# Patient Record
Sex: Male | Born: 1960 | Race: Black or African American | Hispanic: No | Marital: Married | Smoking: Never smoker
Health system: Southern US, Community
[De-identification: ages and names within clinical notes are randomized; demographics above are authoritative.]

## PROBLEM LIST (undated history)

## (undated) DIAGNOSIS — E119 Type 2 diabetes mellitus without complications: Secondary | ICD-10-CM

## (undated) DIAGNOSIS — G4730 Sleep apnea, unspecified: Secondary | ICD-10-CM

## (undated) DIAGNOSIS — N281 Cyst of kidney, acquired: Secondary | ICD-10-CM

## (undated) DIAGNOSIS — M1990 Unspecified osteoarthritis, unspecified site: Secondary | ICD-10-CM

## (undated) HISTORY — PX: APPENDECTOMY: SHX54

## (undated) HISTORY — PX: BACK SURGERY: SHX140

## (undated) HISTORY — PX: MEDIAL COLLATERAL LIGAMENT REPAIR, ELBOW: SHX2018

## (undated) HISTORY — PX: SINUS SURGERY WITH INSTATRAK: SHX5215

## (undated) HISTORY — PX: CHOLECYSTECTOMY: SHX55

---

## 2015-10-07 ENCOUNTER — Encounter (INDEPENDENT_AMBULATORY_CARE_PROVIDER_SITE_OTHER): Payer: BLUE CROSS/BLUE SHIELD | Admitting: Ophthalmology

## 2015-10-07 DIAGNOSIS — H2513 Age-related nuclear cataract, bilateral: Secondary | ICD-10-CM

## 2015-10-07 DIAGNOSIS — E11311 Type 2 diabetes mellitus with unspecified diabetic retinopathy with macular edema: Secondary | ICD-10-CM | POA: Diagnosis not present

## 2015-10-07 DIAGNOSIS — E113313 Type 2 diabetes mellitus with moderate nonproliferative diabetic retinopathy with macular edema, bilateral: Secondary | ICD-10-CM

## 2015-10-07 DIAGNOSIS — H43813 Vitreous degeneration, bilateral: Secondary | ICD-10-CM | POA: Diagnosis not present

## 2015-10-14 ENCOUNTER — Other Ambulatory Visit (INDEPENDENT_AMBULATORY_CARE_PROVIDER_SITE_OTHER): Payer: BLUE CROSS/BLUE SHIELD | Admitting: Ophthalmology

## 2015-10-14 DIAGNOSIS — E11311 Type 2 diabetes mellitus with unspecified diabetic retinopathy with macular edema: Secondary | ICD-10-CM

## 2015-10-14 DIAGNOSIS — E113312 Type 2 diabetes mellitus with moderate nonproliferative diabetic retinopathy with macular edema, left eye: Secondary | ICD-10-CM

## 2015-10-15 ENCOUNTER — Other Ambulatory Visit (INDEPENDENT_AMBULATORY_CARE_PROVIDER_SITE_OTHER): Payer: BLUE CROSS/BLUE SHIELD | Admitting: Ophthalmology

## 2016-02-11 ENCOUNTER — Ambulatory Visit (INDEPENDENT_AMBULATORY_CARE_PROVIDER_SITE_OTHER): Payer: BLUE CROSS/BLUE SHIELD | Admitting: Ophthalmology

## 2016-02-11 DIAGNOSIS — E11311 Type 2 diabetes mellitus with unspecified diabetic retinopathy with macular edema: Secondary | ICD-10-CM | POA: Diagnosis not present

## 2016-02-11 DIAGNOSIS — E113313 Type 2 diabetes mellitus with moderate nonproliferative diabetic retinopathy with macular edema, bilateral: Secondary | ICD-10-CM | POA: Diagnosis not present

## 2016-02-11 DIAGNOSIS — H43813 Vitreous degeneration, bilateral: Secondary | ICD-10-CM | POA: Diagnosis not present

## 2016-02-11 DIAGNOSIS — H2513 Age-related nuclear cataract, bilateral: Secondary | ICD-10-CM | POA: Diagnosis not present

## 2016-08-17 ENCOUNTER — Ambulatory Visit (INDEPENDENT_AMBULATORY_CARE_PROVIDER_SITE_OTHER): Payer: BLUE CROSS/BLUE SHIELD | Admitting: Ophthalmology

## 2016-08-17 DIAGNOSIS — E11319 Type 2 diabetes mellitus with unspecified diabetic retinopathy without macular edema: Secondary | ICD-10-CM

## 2016-08-17 DIAGNOSIS — H43813 Vitreous degeneration, bilateral: Secondary | ICD-10-CM

## 2016-08-17 DIAGNOSIS — E113393 Type 2 diabetes mellitus with moderate nonproliferative diabetic retinopathy without macular edema, bilateral: Secondary | ICD-10-CM

## 2017-02-16 ENCOUNTER — Ambulatory Visit (INDEPENDENT_AMBULATORY_CARE_PROVIDER_SITE_OTHER): Payer: BLUE CROSS/BLUE SHIELD | Admitting: Ophthalmology

## 2017-03-19 ENCOUNTER — Ambulatory Visit (INDEPENDENT_AMBULATORY_CARE_PROVIDER_SITE_OTHER): Payer: BLUE CROSS/BLUE SHIELD | Admitting: Ophthalmology

## 2017-03-19 DIAGNOSIS — H2513 Age-related nuclear cataract, bilateral: Secondary | ICD-10-CM | POA: Diagnosis not present

## 2017-03-19 DIAGNOSIS — E11311 Type 2 diabetes mellitus with unspecified diabetic retinopathy with macular edema: Secondary | ICD-10-CM | POA: Diagnosis not present

## 2017-03-19 DIAGNOSIS — H43813 Vitreous degeneration, bilateral: Secondary | ICD-10-CM | POA: Diagnosis not present

## 2017-03-19 DIAGNOSIS — E113392 Type 2 diabetes mellitus with moderate nonproliferative diabetic retinopathy without macular edema, left eye: Secondary | ICD-10-CM

## 2017-03-19 DIAGNOSIS — E113311 Type 2 diabetes mellitus with moderate nonproliferative diabetic retinopathy with macular edema, right eye: Secondary | ICD-10-CM | POA: Diagnosis not present

## 2017-09-24 ENCOUNTER — Ambulatory Visit (INDEPENDENT_AMBULATORY_CARE_PROVIDER_SITE_OTHER): Payer: BLUE CROSS/BLUE SHIELD | Admitting: Ophthalmology

## 2017-09-24 DIAGNOSIS — E113313 Type 2 diabetes mellitus with moderate nonproliferative diabetic retinopathy with macular edema, bilateral: Secondary | ICD-10-CM

## 2017-09-24 DIAGNOSIS — E11311 Type 2 diabetes mellitus with unspecified diabetic retinopathy with macular edema: Secondary | ICD-10-CM

## 2017-09-24 DIAGNOSIS — H2513 Age-related nuclear cataract, bilateral: Secondary | ICD-10-CM

## 2017-09-24 DIAGNOSIS — H43813 Vitreous degeneration, bilateral: Secondary | ICD-10-CM

## 2017-10-27 ENCOUNTER — Encounter (INDEPENDENT_AMBULATORY_CARE_PROVIDER_SITE_OTHER): Payer: BLUE CROSS/BLUE SHIELD | Admitting: Ophthalmology

## 2017-10-27 DIAGNOSIS — H2513 Age-related nuclear cataract, bilateral: Secondary | ICD-10-CM | POA: Diagnosis not present

## 2017-10-27 DIAGNOSIS — H43813 Vitreous degeneration, bilateral: Secondary | ICD-10-CM | POA: Diagnosis not present

## 2017-10-27 DIAGNOSIS — E11311 Type 2 diabetes mellitus with unspecified diabetic retinopathy with macular edema: Secondary | ICD-10-CM | POA: Diagnosis not present

## 2017-10-27 DIAGNOSIS — E113313 Type 2 diabetes mellitus with moderate nonproliferative diabetic retinopathy with macular edema, bilateral: Secondary | ICD-10-CM | POA: Diagnosis not present

## 2017-11-24 ENCOUNTER — Encounter (INDEPENDENT_AMBULATORY_CARE_PROVIDER_SITE_OTHER): Payer: BLUE CROSS/BLUE SHIELD | Admitting: Ophthalmology

## 2017-11-24 DIAGNOSIS — E103311 Type 1 diabetes mellitus with moderate nonproliferative diabetic retinopathy with macular edema, right eye: Secondary | ICD-10-CM | POA: Diagnosis not present

## 2017-11-24 DIAGNOSIS — H43813 Vitreous degeneration, bilateral: Secondary | ICD-10-CM

## 2017-11-24 DIAGNOSIS — E10311 Type 1 diabetes mellitus with unspecified diabetic retinopathy with macular edema: Secondary | ICD-10-CM | POA: Diagnosis not present

## 2017-11-24 DIAGNOSIS — E103392 Type 1 diabetes mellitus with moderate nonproliferative diabetic retinopathy without macular edema, left eye: Secondary | ICD-10-CM | POA: Diagnosis not present

## 2017-11-24 DIAGNOSIS — H2513 Age-related nuclear cataract, bilateral: Secondary | ICD-10-CM | POA: Diagnosis not present

## 2017-12-29 ENCOUNTER — Encounter (INDEPENDENT_AMBULATORY_CARE_PROVIDER_SITE_OTHER): Payer: BLUE CROSS/BLUE SHIELD | Admitting: Ophthalmology

## 2017-12-29 DIAGNOSIS — E11311 Type 2 diabetes mellitus with unspecified diabetic retinopathy with macular edema: Secondary | ICD-10-CM

## 2017-12-29 DIAGNOSIS — H2513 Age-related nuclear cataract, bilateral: Secondary | ICD-10-CM

## 2017-12-29 DIAGNOSIS — H43813 Vitreous degeneration, bilateral: Secondary | ICD-10-CM

## 2017-12-29 DIAGNOSIS — E113392 Type 2 diabetes mellitus with moderate nonproliferative diabetic retinopathy without macular edema, left eye: Secondary | ICD-10-CM | POA: Diagnosis not present

## 2017-12-29 DIAGNOSIS — E113311 Type 2 diabetes mellitus with moderate nonproliferative diabetic retinopathy with macular edema, right eye: Secondary | ICD-10-CM | POA: Diagnosis not present

## 2018-01-26 ENCOUNTER — Encounter (INDEPENDENT_AMBULATORY_CARE_PROVIDER_SITE_OTHER): Payer: BLUE CROSS/BLUE SHIELD | Admitting: Ophthalmology

## 2018-01-26 DIAGNOSIS — E113392 Type 2 diabetes mellitus with moderate nonproliferative diabetic retinopathy without macular edema, left eye: Secondary | ICD-10-CM | POA: Diagnosis not present

## 2018-01-26 DIAGNOSIS — E113311 Type 2 diabetes mellitus with moderate nonproliferative diabetic retinopathy with macular edema, right eye: Secondary | ICD-10-CM

## 2018-01-26 DIAGNOSIS — E11311 Type 2 diabetes mellitus with unspecified diabetic retinopathy with macular edema: Secondary | ICD-10-CM

## 2018-01-26 DIAGNOSIS — I10 Essential (primary) hypertension: Secondary | ICD-10-CM | POA: Diagnosis not present

## 2018-01-26 DIAGNOSIS — H43813 Vitreous degeneration, bilateral: Secondary | ICD-10-CM

## 2018-01-26 DIAGNOSIS — H2513 Age-related nuclear cataract, bilateral: Secondary | ICD-10-CM | POA: Diagnosis not present

## 2018-01-26 DIAGNOSIS — H35033 Hypertensive retinopathy, bilateral: Secondary | ICD-10-CM | POA: Diagnosis not present

## 2018-02-23 ENCOUNTER — Encounter (INDEPENDENT_AMBULATORY_CARE_PROVIDER_SITE_OTHER): Payer: BLUE CROSS/BLUE SHIELD | Admitting: Ophthalmology

## 2018-02-23 DIAGNOSIS — E11311 Type 2 diabetes mellitus with unspecified diabetic retinopathy with macular edema: Secondary | ICD-10-CM | POA: Diagnosis not present

## 2018-02-23 DIAGNOSIS — H43813 Vitreous degeneration, bilateral: Secondary | ICD-10-CM

## 2018-02-23 DIAGNOSIS — E113392 Type 2 diabetes mellitus with moderate nonproliferative diabetic retinopathy without macular edema, left eye: Secondary | ICD-10-CM

## 2018-02-23 DIAGNOSIS — E113311 Type 2 diabetes mellitus with moderate nonproliferative diabetic retinopathy with macular edema, right eye: Secondary | ICD-10-CM | POA: Diagnosis not present

## 2018-03-24 ENCOUNTER — Encounter (INDEPENDENT_AMBULATORY_CARE_PROVIDER_SITE_OTHER): Payer: BLUE CROSS/BLUE SHIELD | Admitting: Ophthalmology

## 2018-03-24 DIAGNOSIS — H43813 Vitreous degeneration, bilateral: Secondary | ICD-10-CM

## 2018-03-24 DIAGNOSIS — H2513 Age-related nuclear cataract, bilateral: Secondary | ICD-10-CM

## 2018-03-24 DIAGNOSIS — E113311 Type 2 diabetes mellitus with moderate nonproliferative diabetic retinopathy with macular edema, right eye: Secondary | ICD-10-CM | POA: Diagnosis not present

## 2018-03-24 DIAGNOSIS — E113392 Type 2 diabetes mellitus with moderate nonproliferative diabetic retinopathy without macular edema, left eye: Secondary | ICD-10-CM | POA: Diagnosis not present

## 2018-03-24 DIAGNOSIS — E11311 Type 2 diabetes mellitus with unspecified diabetic retinopathy with macular edema: Secondary | ICD-10-CM

## 2018-04-26 ENCOUNTER — Encounter (INDEPENDENT_AMBULATORY_CARE_PROVIDER_SITE_OTHER): Payer: BLUE CROSS/BLUE SHIELD | Admitting: Ophthalmology

## 2018-05-04 ENCOUNTER — Encounter (INDEPENDENT_AMBULATORY_CARE_PROVIDER_SITE_OTHER): Payer: BLUE CROSS/BLUE SHIELD | Admitting: Ophthalmology

## 2018-05-04 DIAGNOSIS — H2513 Age-related nuclear cataract, bilateral: Secondary | ICD-10-CM | POA: Diagnosis not present

## 2018-05-04 DIAGNOSIS — E11311 Type 2 diabetes mellitus with unspecified diabetic retinopathy with macular edema: Secondary | ICD-10-CM

## 2018-05-04 DIAGNOSIS — H43813 Vitreous degeneration, bilateral: Secondary | ICD-10-CM | POA: Diagnosis not present

## 2018-05-04 DIAGNOSIS — E113392 Type 2 diabetes mellitus with moderate nonproliferative diabetic retinopathy without macular edema, left eye: Secondary | ICD-10-CM | POA: Diagnosis not present

## 2018-05-04 DIAGNOSIS — E113311 Type 2 diabetes mellitus with moderate nonproliferative diabetic retinopathy with macular edema, right eye: Secondary | ICD-10-CM

## 2018-05-31 ENCOUNTER — Encounter (INDEPENDENT_AMBULATORY_CARE_PROVIDER_SITE_OTHER): Payer: BLUE CROSS/BLUE SHIELD | Admitting: Ophthalmology

## 2018-05-31 DIAGNOSIS — E113313 Type 2 diabetes mellitus with moderate nonproliferative diabetic retinopathy with macular edema, bilateral: Secondary | ICD-10-CM

## 2018-05-31 DIAGNOSIS — H43813 Vitreous degeneration, bilateral: Secondary | ICD-10-CM | POA: Diagnosis not present

## 2018-05-31 DIAGNOSIS — E11311 Type 2 diabetes mellitus with unspecified diabetic retinopathy with macular edema: Secondary | ICD-10-CM | POA: Diagnosis not present

## 2018-05-31 DIAGNOSIS — H2513 Age-related nuclear cataract, bilateral: Secondary | ICD-10-CM | POA: Diagnosis not present

## 2018-07-12 ENCOUNTER — Encounter (INDEPENDENT_AMBULATORY_CARE_PROVIDER_SITE_OTHER): Payer: BLUE CROSS/BLUE SHIELD | Admitting: Ophthalmology

## 2018-07-12 DIAGNOSIS — E113311 Type 2 diabetes mellitus with moderate nonproliferative diabetic retinopathy with macular edema, right eye: Secondary | ICD-10-CM

## 2018-07-12 DIAGNOSIS — E11311 Type 2 diabetes mellitus with unspecified diabetic retinopathy with macular edema: Secondary | ICD-10-CM

## 2018-07-12 DIAGNOSIS — H43813 Vitreous degeneration, bilateral: Secondary | ICD-10-CM | POA: Diagnosis not present

## 2018-07-12 DIAGNOSIS — E113392 Type 2 diabetes mellitus with moderate nonproliferative diabetic retinopathy without macular edema, left eye: Secondary | ICD-10-CM | POA: Diagnosis not present

## 2018-07-12 DIAGNOSIS — H2513 Age-related nuclear cataract, bilateral: Secondary | ICD-10-CM

## 2018-08-17 ENCOUNTER — Encounter (INDEPENDENT_AMBULATORY_CARE_PROVIDER_SITE_OTHER): Payer: BLUE CROSS/BLUE SHIELD | Admitting: Ophthalmology

## 2018-08-18 ENCOUNTER — Encounter (INDEPENDENT_AMBULATORY_CARE_PROVIDER_SITE_OTHER): Payer: BLUE CROSS/BLUE SHIELD | Admitting: Ophthalmology

## 2018-08-18 DIAGNOSIS — E113311 Type 2 diabetes mellitus with moderate nonproliferative diabetic retinopathy with macular edema, right eye: Secondary | ICD-10-CM

## 2018-08-18 DIAGNOSIS — H43813 Vitreous degeneration, bilateral: Secondary | ICD-10-CM | POA: Diagnosis not present

## 2018-08-18 DIAGNOSIS — E11311 Type 2 diabetes mellitus with unspecified diabetic retinopathy with macular edema: Secondary | ICD-10-CM

## 2018-08-18 DIAGNOSIS — H2513 Age-related nuclear cataract, bilateral: Secondary | ICD-10-CM

## 2018-08-18 DIAGNOSIS — E113392 Type 2 diabetes mellitus with moderate nonproliferative diabetic retinopathy without macular edema, left eye: Secondary | ICD-10-CM

## 2018-09-21 ENCOUNTER — Encounter (INDEPENDENT_AMBULATORY_CARE_PROVIDER_SITE_OTHER): Payer: BLUE CROSS/BLUE SHIELD | Admitting: Ophthalmology

## 2018-09-21 DIAGNOSIS — E11311 Type 2 diabetes mellitus with unspecified diabetic retinopathy with macular edema: Secondary | ICD-10-CM | POA: Diagnosis not present

## 2018-09-21 DIAGNOSIS — H2513 Age-related nuclear cataract, bilateral: Secondary | ICD-10-CM

## 2018-09-21 DIAGNOSIS — H43813 Vitreous degeneration, bilateral: Secondary | ICD-10-CM

## 2018-09-21 DIAGNOSIS — E113311 Type 2 diabetes mellitus with moderate nonproliferative diabetic retinopathy with macular edema, right eye: Secondary | ICD-10-CM | POA: Diagnosis not present

## 2018-09-21 DIAGNOSIS — E113592 Type 2 diabetes mellitus with proliferative diabetic retinopathy without macular edema, left eye: Secondary | ICD-10-CM

## 2018-09-28 ENCOUNTER — Encounter (INDEPENDENT_AMBULATORY_CARE_PROVIDER_SITE_OTHER): Payer: BLUE CROSS/BLUE SHIELD | Admitting: Ophthalmology

## 2018-09-28 DIAGNOSIS — E11311 Type 2 diabetes mellitus with unspecified diabetic retinopathy with macular edema: Secondary | ICD-10-CM | POA: Diagnosis not present

## 2018-09-28 DIAGNOSIS — E113592 Type 2 diabetes mellitus with proliferative diabetic retinopathy without macular edema, left eye: Secondary | ICD-10-CM | POA: Diagnosis not present

## 2018-10-27 ENCOUNTER — Encounter (INDEPENDENT_AMBULATORY_CARE_PROVIDER_SITE_OTHER): Payer: BLUE CROSS/BLUE SHIELD | Admitting: Ophthalmology

## 2018-10-27 DIAGNOSIS — E11311 Type 2 diabetes mellitus with unspecified diabetic retinopathy with macular edema: Secondary | ICD-10-CM

## 2018-10-27 DIAGNOSIS — E113592 Type 2 diabetes mellitus with proliferative diabetic retinopathy without macular edema, left eye: Secondary | ICD-10-CM | POA: Diagnosis not present

## 2018-10-27 DIAGNOSIS — H2513 Age-related nuclear cataract, bilateral: Secondary | ICD-10-CM

## 2018-10-27 DIAGNOSIS — H43813 Vitreous degeneration, bilateral: Secondary | ICD-10-CM

## 2018-10-27 DIAGNOSIS — E113311 Type 2 diabetes mellitus with moderate nonproliferative diabetic retinopathy with macular edema, right eye: Secondary | ICD-10-CM

## 2018-11-22 ENCOUNTER — Other Ambulatory Visit: Payer: Self-pay | Admitting: Urology

## 2018-11-22 DIAGNOSIS — N281 Cyst of kidney, acquired: Secondary | ICD-10-CM

## 2018-11-24 ENCOUNTER — Other Ambulatory Visit: Payer: Self-pay | Admitting: Family Medicine

## 2018-11-24 ENCOUNTER — Ambulatory Visit
Admission: RE | Admit: 2018-11-24 | Discharge: 2018-11-24 | Disposition: A | Discharge status: Home / Self Care | Payer: Self-pay | Source: Ambulatory Visit | Attending: Family Medicine | Admitting: Family Medicine

## 2018-11-24 DIAGNOSIS — R1084 Generalized abdominal pain: Secondary | ICD-10-CM

## 2018-11-25 ENCOUNTER — Other Ambulatory Visit: Payer: Self-pay | Admitting: Urology

## 2018-11-25 DIAGNOSIS — N281 Cyst of kidney, acquired: Secondary | ICD-10-CM

## 2018-11-28 ENCOUNTER — Other Ambulatory Visit: Payer: Self-pay | Admitting: Radiology

## 2018-11-29 ENCOUNTER — Other Ambulatory Visit: Payer: Self-pay | Admitting: Radiology

## 2018-12-01 ENCOUNTER — Encounter (HOSPITAL_COMMUNITY): Payer: Self-pay

## 2018-12-01 ENCOUNTER — Ambulatory Visit (HOSPITAL_COMMUNITY)
Admission: RE | Admit: 2018-12-01 | Discharge: 2018-12-01 | Disposition: A | Discharge status: Home / Self Care | Payer: BLUE CROSS/BLUE SHIELD | Source: Ambulatory Visit | Attending: Urology | Admitting: Urology

## 2018-12-01 DIAGNOSIS — Z833 Family history of diabetes mellitus: Secondary | ICD-10-CM | POA: Diagnosis not present

## 2018-12-01 DIAGNOSIS — Z9049 Acquired absence of other specified parts of digestive tract: Secondary | ICD-10-CM | POA: Insufficient documentation

## 2018-12-01 DIAGNOSIS — N281 Cyst of kidney, acquired: Secondary | ICD-10-CM | POA: Diagnosis not present

## 2018-12-01 DIAGNOSIS — M549 Dorsalgia, unspecified: Secondary | ICD-10-CM | POA: Insufficient documentation

## 2018-12-01 DIAGNOSIS — E119 Type 2 diabetes mellitus without complications: Secondary | ICD-10-CM | POA: Insufficient documentation

## 2018-12-01 DIAGNOSIS — Z7984 Long term (current) use of oral hypoglycemic drugs: Secondary | ICD-10-CM | POA: Insufficient documentation

## 2018-12-01 DIAGNOSIS — Z79899 Other long term (current) drug therapy: Secondary | ICD-10-CM | POA: Diagnosis not present

## 2018-12-01 HISTORY — DX: Type 2 diabetes mellitus without complications: E11.9

## 2018-12-01 HISTORY — DX: Cyst of kidney, acquired: N28.1

## 2018-12-01 LAB — CBC
HEMATOCRIT: 41.6 % (ref 39.0–52.0)
Hemoglobin: 13.6 g/dL (ref 13.0–17.0)
MCH: 28.8 pg (ref 26.0–34.0)
MCHC: 32.7 g/dL (ref 30.0–36.0)
MCV: 87.9 fL (ref 80.0–100.0)
Platelets: 220 10*3/uL (ref 150–400)
RBC: 4.73 MIL/uL (ref 4.22–5.81)
RDW: 11.9 % (ref 11.5–15.5)
WBC: 4.9 10*3/uL (ref 4.0–10.5)
nRBC: 0 % (ref 0.0–0.2)

## 2018-12-01 LAB — PROTIME-INR
INR: 0.91
Prothrombin Time: 12.2 seconds (ref 11.4–15.2)

## 2018-12-01 LAB — GLUCOSE, CAPILLARY: Glucose-Capillary: 203 mg/dL — ABNORMAL HIGH (ref 70–99)

## 2018-12-01 MED ORDER — LIDOCAINE HCL (PF) 1 % IJ SOLN
INTRAMUSCULAR | Status: AC
  Filled 2018-12-01: qty 30

## 2018-12-01 MED ORDER — MIDAZOLAM HCL 2 MG/2ML IJ SOLN
INTRAMUSCULAR | Status: AC
  Filled 2018-12-01: qty 2

## 2018-12-01 MED ORDER — SODIUM CHLORIDE 0.9 % IV SOLN: INTRAVENOUS | Status: DC

## 2018-12-01 MED ORDER — FENTANYL CITRATE (PF) 100 MCG/2ML IJ SOLN
INTRAMUSCULAR | Status: AC | PRN
  Administered 2018-12-01: 50 ug via INTRAVENOUS

## 2018-12-01 MED ORDER — MIDAZOLAM HCL 2 MG/2ML IJ SOLN
INTRAMUSCULAR | Status: AC | PRN
  Administered 2018-12-01: 1 mg via INTRAVENOUS
  Administered 2018-12-01: 0.5 mg via INTRAVENOUS

## 2018-12-01 MED ORDER — FENTANYL CITRATE (PF) 100 MCG/2ML IJ SOLN
INTRAMUSCULAR | Status: AC
  Filled 2018-12-01: qty 2

## 2018-12-01 NOTE — Discharge Instructions (Addendum)
Needle Biopsy, Care After °These instructions tell you how to care for yourself after your procedure. Your doctor may also give you more specific instructions. Call your doctor if you have any problems or questions. °What can I expect after the procedure? °After the procedure, it is common to have: °· Soreness. °· Bruising. °· Mild pain. °Follow these instructions at home: ° °· Return to your normal activities as told by your doctor. Ask your doctor what activities are safe for you. °· Take over-the-counter and prescription medicines only as told by your doctor. °· Wash your hands with soap and water before you change your bandage (dressing). If you cannot use soap and water, use hand sanitizer. °· Follow instructions from your doctor about: °? How to take care of your puncture site. °? When and how to change your bandage. °? When to remove your bandage. °· Check your puncture site every day for signs of infection. Watch for: °? Redness, swelling, or pain. °? Fluid or blood.  °? Pus or a bad smell. °? Warmth. °· Do not take baths, swim, or use a hot tub until your doctor approves. Ask your doctor if you may take showers. You may only be allowed to take sponge baths. °· Keep all follow-up visits as told by your doctor. This is important. °Contact a doctor if you have: °· A fever. °· Redness, swelling, or pain at the puncture site, and it lasts longer than a few days. °· Fluid, blood, or pus coming from the puncture site. °· Warmth coming from the puncture site. °Get help right away if: °· You have a lot of bleeding from the puncture site. °Summary °· After the procedure, it is common to have soreness, bruising, or mild pain at the puncture site. °· Check your puncture site every day for signs of infection, such as redness, swelling, or pain. °· Get help right away if you have severe bleeding from your puncture site. °This information is not intended to replace advice given to you by your health care provider. Make  sure you discuss any questions you have with your health care provider. °Document Released: 11/05/2008 Document Revised: 12/06/2017 Document Reviewed: 12/06/2017 °Elsevier Interactive Patient Education © 2019 [employer] °Moderate Conscious Sedation, Adult, Care After °These instructions provide you with information about caring for yourself after your procedure. Your health care provider may also give you more specific instructions. Your treatment has been planned according to current medical practices, but problems sometimes occur. Call your health care provider if you have any problems or questions after your procedure. °What can I expect after the procedure? °After your procedure, it is common: °· To feel sleepy for several hours. °· To feel clumsy and have poor balance for several hours. °· To have poor judgment for several hours. °· To vomit if you eat too soon. °Follow these instructions at home: °For at least 24 hours after the procedure: ° °· Do not: °? Participate in activities where you could fall or become injured. °? Drive. °? Use heavy machinery. °? Drink alcohol. °? Take sleeping pills or medicines that cause drowsiness. °? Make important decisions or sign legal documents. °? Take care of children on your own. °· Rest. °Eating and drinking °· Follow the diet recommended by your health care provider. °· If you vomit: °? Drink water, juice, or soup when you can drink without vomiting. °? Make sure you have little or no nausea before eating solid foods. °General instructions °· Have a responsible adult stay   with you until you are awake and alert. °· Take over-the-counter and prescription medicines only as told by your health care provider. °· If you smoke, do not smoke without supervision. °· Keep all follow-up visits as told by your health care provider. This is important. °Contact a health care provider if: °· You keep feeling nauseous or you keep vomiting. °· You feel light-headed. °· You develop a  rash. °· You have a fever. °Get help right away if: °· You have trouble breathing. °This information is not intended to replace advice given to you by your health care provider. Make sure you discuss any questions you have with your health care provider. °Document Released: 09/13/2013 Document Revised: 04/27/2016 Document Reviewed: 03/14/2016 °Elsevier Interactive Patient Education © 2019 Elsevier Inc. ° °

## 2018-12-01 NOTE — Procedures (Signed)
[place] guided R renal cyst aspiration 50 cc clear yellow fluid EBL 0 Comp 0

## 2018-12-01 NOTE — H&P (Signed)
Chief Complaint: Patient was seen in consultation today for Right renal cyst aspiration at the request of Wrenn,John  Referring Physician(s): Bjorn PippinWrenn,John  Supervising Physician: Jolaine ClickHoss, Arthur  [facility]  History of Present Illness: Marc Gwyneth SproutBrown Jr. is a 57 y.o. male   Right abd pain for months Rt back pain more recently  Referred to Dr Annabell HowellsWrenn Right renal cyst seen on outside films  (in PACS)  Now scheduled for aspiration    Past Medical History:  Diagnosis Date  . Diabetes mellitus without complication (HCC)   . Renal cyst     Past Surgical History:  Procedure Laterality Date  . APPENDECTOMY    . BACK SURGERY    . CHOLECYSTECTOMY    . MEDIAL COLLATERAL LIGAMENT REPAIR, ELBOW    . SINUS SURGERY WITH INSTATRAK      Allergies: Patient has no known allergies.  Medications: Prior to Admission medications   Medication Sig Start Date End Date Taking? Authorizing Provider  atorvastatin (LIPITOR) 40 MG tablet Take 40 mg by mouth daily.   Yes [provider]  empagliflozin (JARDIANCE) 25 MG TABS tablet Take 25 mg by mouth daily.   Yes [provider]  losartan (COZAAR) 50 MG tablet Take 50 mg by mouth daily.   Yes [provider]  sitaGLIPtin-metformin (JANUMET) 50-1000 MG tablet Take 1 tablet by mouth 2 (two) times daily with a meal.   Yes [provider]     Family History  Problem Relation Age of Onset  . Diabetes Mother   . Glaucoma Father     Social History   Socioeconomic History  . Marital status: Married    Spouse name: Not on file  . Number of children: Not on file  . Years of education: Not on file  . Highest education level: Not on file  Occupational History  . Not on file  Social Needs  . Financial resource strain: Not on file  . Food insecurity:    Worry: Not on file    Inability: Not on file  . Transportation needs:    Medical: Not on file    Non-medical: Not on file  Tobacco Use    . Smoking status: Never Smoker  . Smokeless tobacco: Never Used  Substance and Sexual Activity  . Alcohol use: Never    Frequency: Never  . Drug use: Never  . Sexual activity: Not on file  Lifestyle  . Physical activity:    Days per week: Not on file    Minutes per session: Not on file  . Stress: Not on file  Relationships  . Social connections:    Talks on phone: Not on file    Gets together: Not on file    Attends religious service: Not on file    Active member of club or organization: Not on file    Attends meetings of clubs or organizations: Not on file    Relationship status: Not on file  Other Topics Concern  . Not on file  Social History Narrative  . Not on file    Review of Systems: A 12 point ROS discussed and pertinent positives are indicated in the HPI above.  All other systems are negative.  Review of Systems  Constitutional: Positive for activity change. Negative for appetite change, fatigue and fever.  Respiratory: Negative for shortness of breath.   Cardiovascular: Negative for chest pain.  Gastrointestinal: Positive for abdominal pain.  Musculoskeletal: Positive for back pain.  Neurological: Negative  for weakness.  Psychiatric/Behavioral: Negative for behavioral problems and confusion.    Vital Signs: BP 125/82   Pulse (!) 47   Temp 97.9 F (36.6 C) (Oral)   Resp 16   Ht 5\' 11"  (1.803 m)   Wt 182 lb (82.6 kg)   SpO2 98%   BMI 25.38 kg/m   Physical Exam Vitals signs reviewed.  Cardiovascular:     Rate and Rhythm: Normal rate and regular rhythm.  Pulmonary:     Effort: Pulmonary effort is normal.     Breath sounds: Normal breath sounds.  Abdominal:     General: Bowel sounds are normal.     Palpations: Abdomen is soft.  Musculoskeletal: Normal range of motion.  Skin:    General: Skin is warm and dry.  Neurological:     General: No focal deficit present.     Mental Status: He is oriented to person, place, and time.  Psychiatric:         Mood and Affect: Mood normal.        Behavior: Behavior normal.        Thought Content: Thought content normal.        Judgment: Judgment normal.     Imaging: No results found.  Labs:  CBC: Recent Labs    12/01/18 0615  WBC 4.9  HGB 13.6  HCT 41.6  PLT 220    COAGS: Recent Labs    12/01/18 0615  INR 0.91    BMP: No results for input(s): NA, K, CL, CO2, GLUCOSE, BUN, CALCIUM, CREATININE, GFRNONAA, GFRAA in the last 8760 hours.  Invalid input(s): CMP  LIVER FUNCTION TESTS: No results for input(s): BILITOT, AST, ALT, ALKPHOS, PROT, ALBUMIN in the last 8760 hours.  TUMOR MARKERS: No results for input(s): AFPTM, CEA, CA199, CHROMGRNA in the last 8760 hours.  Assessment and Plan:  Abd and back pain for months Rt renal cyst noted on imaging Now scheduled for aspiration Risks and benefits discussed with the patient including, but not limited to bleeding, infection, damage to adjacent structures or low yield requiring additional tests.  All of the patient's questions were answered, patient is agreeable to proceed. Consent signed and in chart.    Thank you for this interesting consult.  I greatly enjoyed meeting Daimon Gwyneth SproutBrown Jr. and look forward to participating in their care.  A copy of this report was sent to the requesting provider on this date.  Electronically Signed: Robet LeuPamela A Ashiah Karpowicz, PA-C 12/01/2018, 7:49 AM   I spent a total of  30 Minutes   in face to face in clinical consultation, greater than 50% of which was counseling/coordinating care for right renal cyst aspiration

## 2018-12-02 ENCOUNTER — Ambulatory Visit (HOSPITAL_COMMUNITY): Payer: Self-pay

## 2018-12-08 ENCOUNTER — Encounter (INDEPENDENT_AMBULATORY_CARE_PROVIDER_SITE_OTHER): Payer: BLUE CROSS/BLUE SHIELD | Admitting: Ophthalmology

## 2018-12-08 DIAGNOSIS — E113311 Type 2 diabetes mellitus with moderate nonproliferative diabetic retinopathy with macular edema, right eye: Secondary | ICD-10-CM

## 2018-12-08 DIAGNOSIS — H43813 Vitreous degeneration, bilateral: Secondary | ICD-10-CM | POA: Diagnosis not present

## 2018-12-08 DIAGNOSIS — E113512 Type 2 diabetes mellitus with proliferative diabetic retinopathy with macular edema, left eye: Secondary | ICD-10-CM | POA: Diagnosis not present

## 2018-12-08 DIAGNOSIS — E11311 Type 2 diabetes mellitus with unspecified diabetic retinopathy with macular edema: Secondary | ICD-10-CM | POA: Diagnosis not present

## 2019-01-18 ENCOUNTER — Encounter (INDEPENDENT_AMBULATORY_CARE_PROVIDER_SITE_OTHER): Payer: BLUE CROSS/BLUE SHIELD | Admitting: Ophthalmology

## 2019-01-18 DIAGNOSIS — E11311 Type 2 diabetes mellitus with unspecified diabetic retinopathy with macular edema: Secondary | ICD-10-CM

## 2019-01-18 DIAGNOSIS — E113311 Type 2 diabetes mellitus with moderate nonproliferative diabetic retinopathy with macular edema, right eye: Secondary | ICD-10-CM | POA: Diagnosis not present

## 2019-01-18 DIAGNOSIS — H43813 Vitreous degeneration, bilateral: Secondary | ICD-10-CM | POA: Diagnosis not present

## 2019-01-18 DIAGNOSIS — E113592 Type 2 diabetes mellitus with proliferative diabetic retinopathy without macular edema, left eye: Secondary | ICD-10-CM | POA: Diagnosis not present

## 2019-03-08 ENCOUNTER — Encounter (INDEPENDENT_AMBULATORY_CARE_PROVIDER_SITE_OTHER): Payer: BLUE CROSS/BLUE SHIELD | Admitting: Ophthalmology

## 2019-03-08 ENCOUNTER — Other Ambulatory Visit: Payer: Self-pay

## 2019-03-08 DIAGNOSIS — E113311 Type 2 diabetes mellitus with moderate nonproliferative diabetic retinopathy with macular edema, right eye: Secondary | ICD-10-CM

## 2019-03-08 DIAGNOSIS — E113592 Type 2 diabetes mellitus with proliferative diabetic retinopathy without macular edema, left eye: Secondary | ICD-10-CM | POA: Diagnosis not present

## 2019-03-08 DIAGNOSIS — H2513 Age-related nuclear cataract, bilateral: Secondary | ICD-10-CM

## 2019-03-08 DIAGNOSIS — E11311 Type 2 diabetes mellitus with unspecified diabetic retinopathy with macular edema: Secondary | ICD-10-CM

## 2019-03-08 DIAGNOSIS — H43813 Vitreous degeneration, bilateral: Secondary | ICD-10-CM | POA: Diagnosis not present

## 2019-04-19 ENCOUNTER — Other Ambulatory Visit: Payer: Self-pay

## 2019-04-19 ENCOUNTER — Encounter (INDEPENDENT_AMBULATORY_CARE_PROVIDER_SITE_OTHER): Payer: BLUE CROSS/BLUE SHIELD | Admitting: Ophthalmology

## 2019-04-19 DIAGNOSIS — H43813 Vitreous degeneration, bilateral: Secondary | ICD-10-CM

## 2019-04-19 DIAGNOSIS — E113311 Type 2 diabetes mellitus with moderate nonproliferative diabetic retinopathy with macular edema, right eye: Secondary | ICD-10-CM | POA: Diagnosis not present

## 2019-04-19 DIAGNOSIS — E11311 Type 2 diabetes mellitus with unspecified diabetic retinopathy with macular edema: Secondary | ICD-10-CM

## 2019-04-19 DIAGNOSIS — H2513 Age-related nuclear cataract, bilateral: Secondary | ICD-10-CM

## 2019-04-19 DIAGNOSIS — E113592 Type 2 diabetes mellitus with proliferative diabetic retinopathy without macular edema, left eye: Secondary | ICD-10-CM | POA: Diagnosis not present

## 2019-05-31 ENCOUNTER — Encounter (INDEPENDENT_AMBULATORY_CARE_PROVIDER_SITE_OTHER): Payer: BLUE CROSS/BLUE SHIELD | Admitting: Ophthalmology

## 2019-05-31 ENCOUNTER — Other Ambulatory Visit: Payer: Self-pay

## 2019-05-31 DIAGNOSIS — H2513 Age-related nuclear cataract, bilateral: Secondary | ICD-10-CM

## 2019-05-31 DIAGNOSIS — H43813 Vitreous degeneration, bilateral: Secondary | ICD-10-CM | POA: Diagnosis not present

## 2019-05-31 DIAGNOSIS — E113313 Type 2 diabetes mellitus with moderate nonproliferative diabetic retinopathy with macular edema, bilateral: Secondary | ICD-10-CM

## 2019-05-31 DIAGNOSIS — E11311 Type 2 diabetes mellitus with unspecified diabetic retinopathy with macular edema: Secondary | ICD-10-CM | POA: Diagnosis not present

## 2019-05-31 DIAGNOSIS — E113592 Type 2 diabetes mellitus with proliferative diabetic retinopathy without macular edema, left eye: Secondary | ICD-10-CM | POA: Diagnosis not present

## 2019-06-28 ENCOUNTER — Encounter (INDEPENDENT_AMBULATORY_CARE_PROVIDER_SITE_OTHER): Payer: BC Managed Care – PPO | Admitting: Ophthalmology

## 2019-07-13 ENCOUNTER — Other Ambulatory Visit: Payer: Self-pay

## 2019-07-13 ENCOUNTER — Encounter (INDEPENDENT_AMBULATORY_CARE_PROVIDER_SITE_OTHER): Payer: BC Managed Care – PPO | Admitting: Ophthalmology

## 2019-07-13 DIAGNOSIS — H2513 Age-related nuclear cataract, bilateral: Secondary | ICD-10-CM

## 2019-07-13 DIAGNOSIS — E103592 Type 1 diabetes mellitus with proliferative diabetic retinopathy without macular edema, left eye: Secondary | ICD-10-CM

## 2019-07-13 DIAGNOSIS — E10311 Type 1 diabetes mellitus with unspecified diabetic retinopathy with macular edema: Secondary | ICD-10-CM | POA: Diagnosis not present

## 2019-07-13 DIAGNOSIS — H43813 Vitreous degeneration, bilateral: Secondary | ICD-10-CM | POA: Diagnosis not present

## 2019-07-13 DIAGNOSIS — E103311 Type 1 diabetes mellitus with moderate nonproliferative diabetic retinopathy with macular edema, right eye: Secondary | ICD-10-CM | POA: Diagnosis not present

## 2019-08-09 ENCOUNTER — Encounter (INDEPENDENT_AMBULATORY_CARE_PROVIDER_SITE_OTHER): Payer: BC Managed Care – PPO | Admitting: Ophthalmology

## 2019-08-24 ENCOUNTER — Encounter (INDEPENDENT_AMBULATORY_CARE_PROVIDER_SITE_OTHER): Payer: BC Managed Care – PPO | Admitting: Ophthalmology

## 2019-08-24 ENCOUNTER — Other Ambulatory Visit: Payer: Self-pay

## 2019-08-24 DIAGNOSIS — H43813 Vitreous degeneration, bilateral: Secondary | ICD-10-CM

## 2019-08-24 DIAGNOSIS — E113311 Type 2 diabetes mellitus with moderate nonproliferative diabetic retinopathy with macular edema, right eye: Secondary | ICD-10-CM | POA: Diagnosis not present

## 2019-08-24 DIAGNOSIS — E113592 Type 2 diabetes mellitus with proliferative diabetic retinopathy without macular edema, left eye: Secondary | ICD-10-CM | POA: Diagnosis not present

## 2019-08-24 DIAGNOSIS — E11311 Type 2 diabetes mellitus with unspecified diabetic retinopathy with macular edema: Secondary | ICD-10-CM

## 2019-08-24 DIAGNOSIS — H2513 Age-related nuclear cataract, bilateral: Secondary | ICD-10-CM

## 2019-10-04 ENCOUNTER — Encounter (INDEPENDENT_AMBULATORY_CARE_PROVIDER_SITE_OTHER): Payer: BC Managed Care – PPO | Admitting: Ophthalmology

## 2019-10-04 DIAGNOSIS — E11311 Type 2 diabetes mellitus with unspecified diabetic retinopathy with macular edema: Secondary | ICD-10-CM

## 2019-10-04 DIAGNOSIS — H2513 Age-related nuclear cataract, bilateral: Secondary | ICD-10-CM

## 2019-10-04 DIAGNOSIS — E113512 Type 2 diabetes mellitus with proliferative diabetic retinopathy with macular edema, left eye: Secondary | ICD-10-CM

## 2019-10-04 DIAGNOSIS — E113311 Type 2 diabetes mellitus with moderate nonproliferative diabetic retinopathy with macular edema, right eye: Secondary | ICD-10-CM

## 2019-10-04 DIAGNOSIS — H43813 Vitreous degeneration, bilateral: Secondary | ICD-10-CM | POA: Diagnosis not present

## 2019-11-15 ENCOUNTER — Encounter (INDEPENDENT_AMBULATORY_CARE_PROVIDER_SITE_OTHER): Payer: BC Managed Care – PPO | Admitting: Ophthalmology

## 2019-11-15 DIAGNOSIS — H43813 Vitreous degeneration, bilateral: Secondary | ICD-10-CM

## 2019-11-15 DIAGNOSIS — E11311 Type 2 diabetes mellitus with unspecified diabetic retinopathy with macular edema: Secondary | ICD-10-CM | POA: Diagnosis not present

## 2019-11-15 DIAGNOSIS — E113592 Type 2 diabetes mellitus with proliferative diabetic retinopathy without macular edema, left eye: Secondary | ICD-10-CM

## 2019-11-15 DIAGNOSIS — E113311 Type 2 diabetes mellitus with moderate nonproliferative diabetic retinopathy with macular edema, right eye: Secondary | ICD-10-CM

## 2019-11-15 DIAGNOSIS — H2513 Age-related nuclear cataract, bilateral: Secondary | ICD-10-CM

## 2019-12-12 IMAGING — US US GUIDANCE NEEDLE PLACEMENT
1 series · 8 of 8 positions shown · non-contrast
Comparison: none

INDICATION: Complex right renal cyst
TECHNIQUE: Informed written consent was obtained from the patient after a
thorough discussion of the procedural risks, benefits and
alternatives. All questions were addressed. Maximal Sterile Barrier
Technique was utilized including caps, mask, sterile gowns, sterile
gloves, sterile drape, hand hygiene and skin antiseptic. A timeout
was performed prior to the initiation of the procedure.

The right low back was prepped and draped in a sterile fashion. 1%
lidocaine was utilized for local anesthesia. Under sonographic
guidance, a you we Angiocath was inserted into the complex cyst. 50
cc clear yellow fluid was aspirated and sent for cytology.

[Series 1: us guidance needle placement · 8 of 8 slices shown]
[im 1/8]
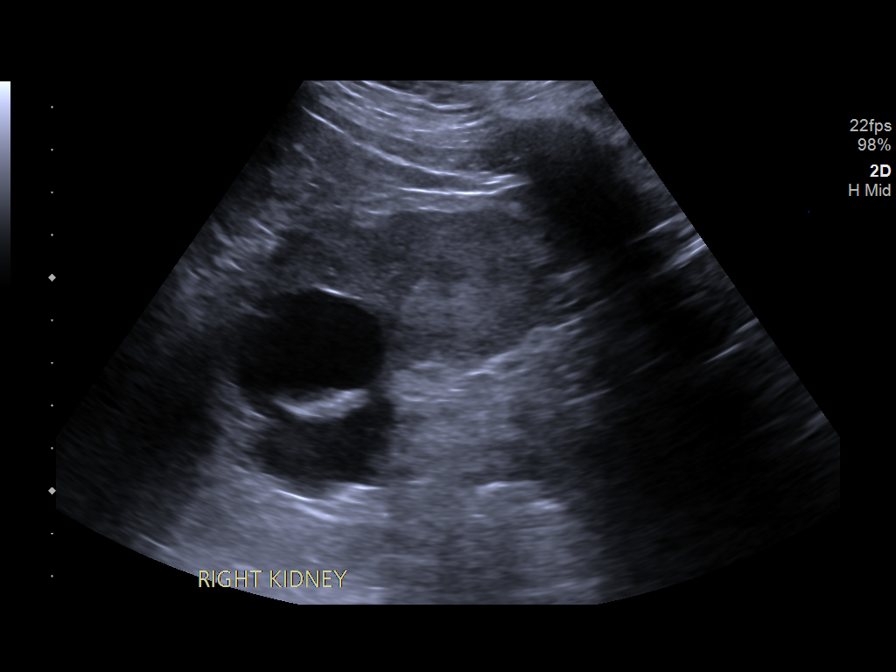
[im 2/8]
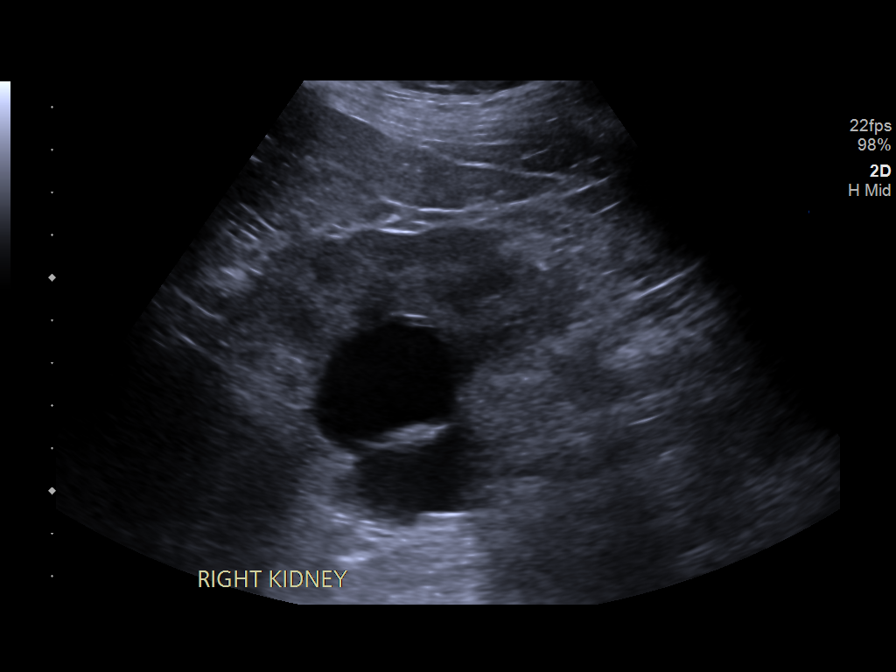
[im 3/8]
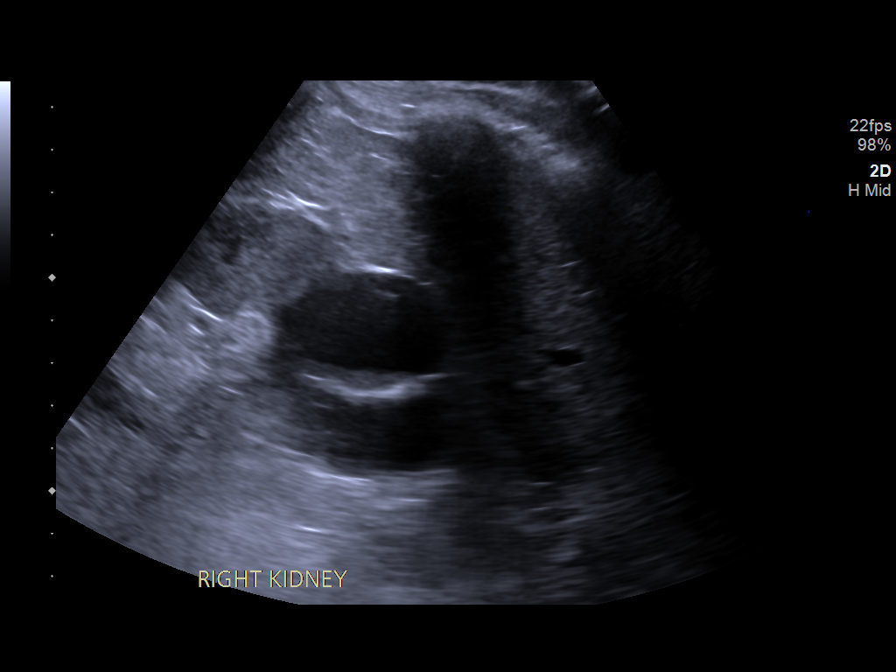
[im 4/8]
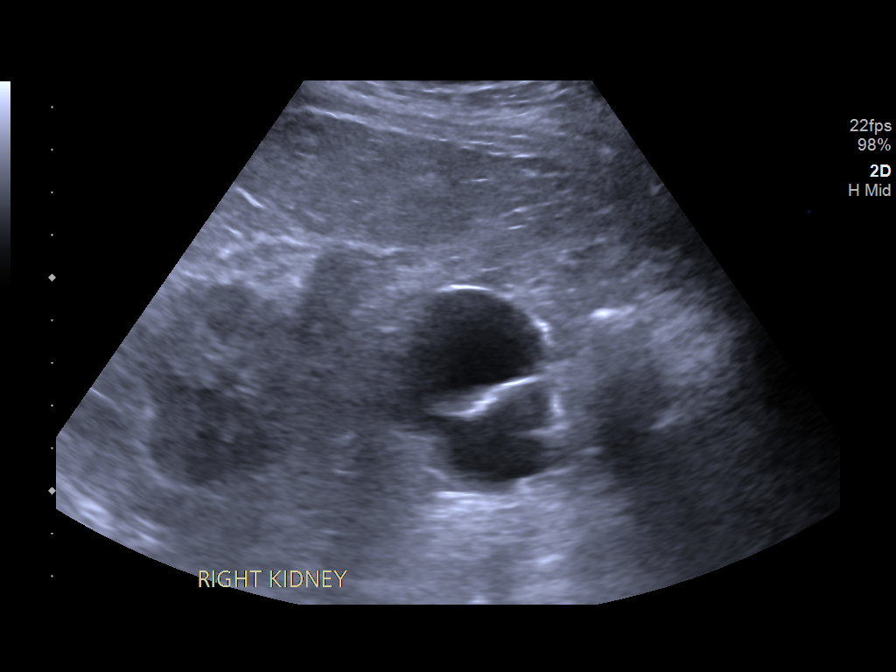
[im 5/8]
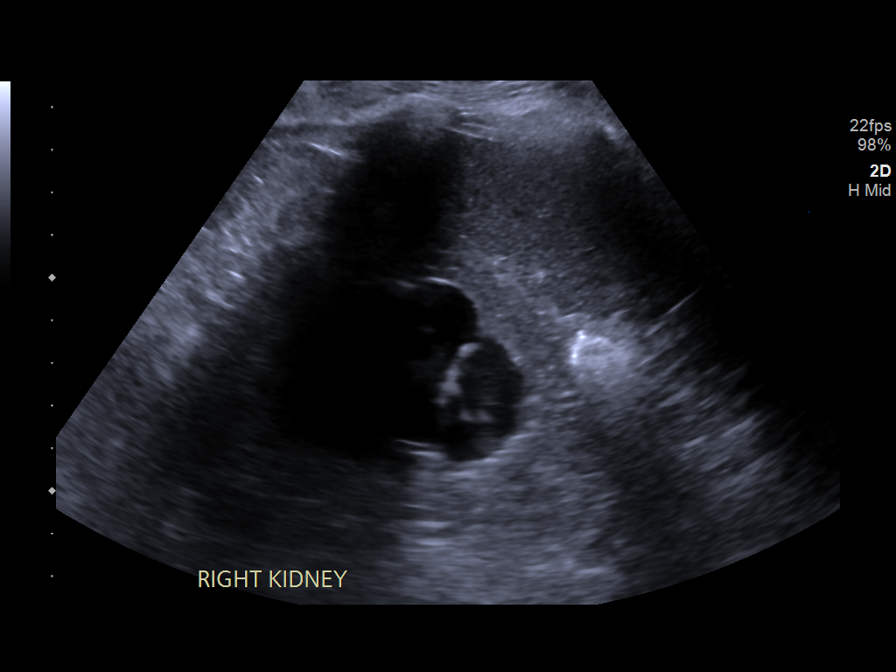
[im 6/8]
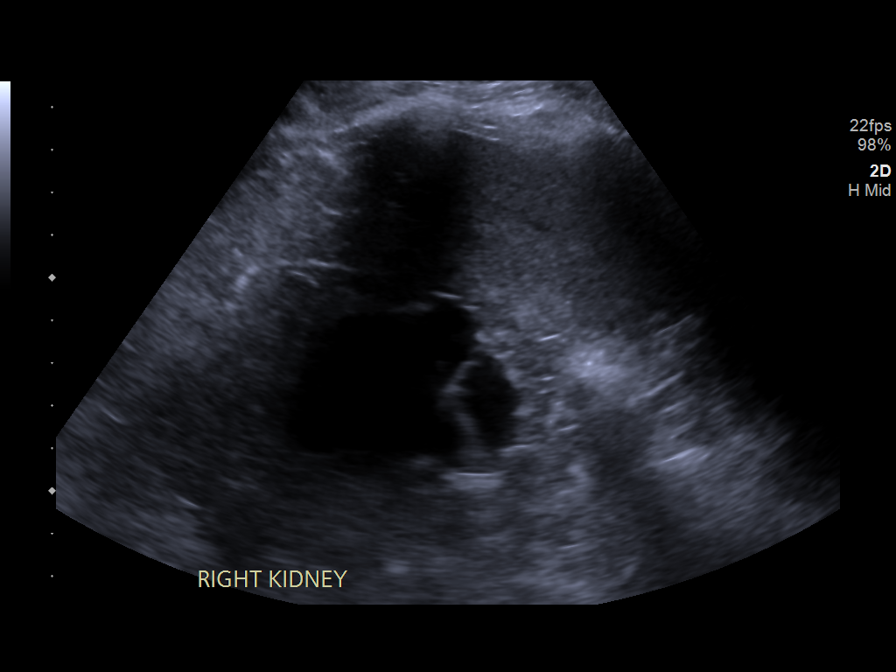
[im 7/8]
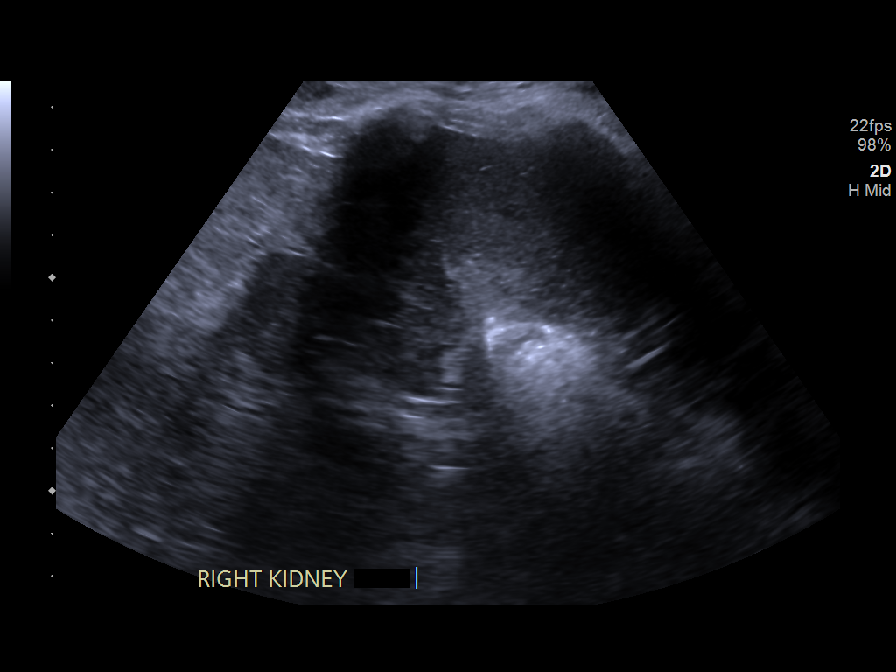
[im 8/8]
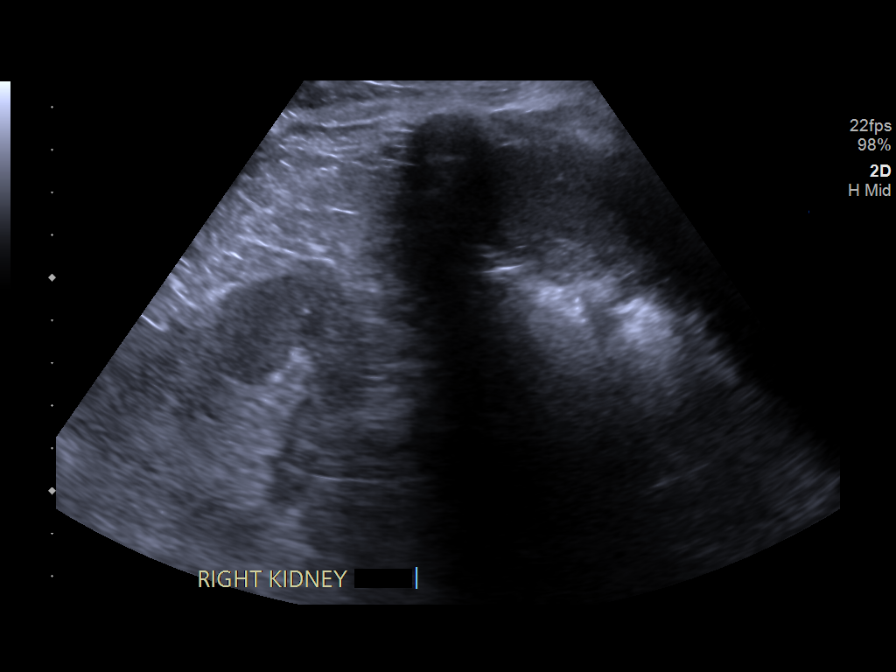

[8 of 8 positions shown; findings below may reference images not displayed]

EXAM:
IR ULTRASOUND GUIDED ASPIRATION/DRAINAGE

MEDICATIONS:
The patient is currently admitted to the hospital and receiving
intravenous antibiotics. The antibiotics were administered within an
appropriate time frame prior to the initiation of the procedure.

ANESTHESIA/SEDATION:
Fentanyl 50 mcg IV; Versed 1.5 mg IV

Moderate Sedation Time:  10 minutes

The patient was continuously monitored during the procedure by the
interventional radiology nurse under my direct supervision.

COMPLICATIONS:
None immediate.
FINDINGS: Post aspiration imaging demonstrates resolution of the cyst.
IMPRESSION: Technically successful ultrasound-guided aspiration of a right renal
cyst.

## 2019-12-20 ENCOUNTER — Encounter (INDEPENDENT_AMBULATORY_CARE_PROVIDER_SITE_OTHER): Payer: BC Managed Care – PPO | Admitting: Ophthalmology

## 2019-12-20 DIAGNOSIS — E113311 Type 2 diabetes mellitus with moderate nonproliferative diabetic retinopathy with macular edema, right eye: Secondary | ICD-10-CM

## 2019-12-20 DIAGNOSIS — E113592 Type 2 diabetes mellitus with proliferative diabetic retinopathy without macular edema, left eye: Secondary | ICD-10-CM | POA: Diagnosis not present

## 2019-12-20 DIAGNOSIS — H43813 Vitreous degeneration, bilateral: Secondary | ICD-10-CM

## 2019-12-20 DIAGNOSIS — E11311 Type 2 diabetes mellitus with unspecified diabetic retinopathy with macular edema: Secondary | ICD-10-CM | POA: Diagnosis not present

## 2020-01-31 ENCOUNTER — Encounter (INDEPENDENT_AMBULATORY_CARE_PROVIDER_SITE_OTHER): Payer: BC Managed Care – PPO | Admitting: Ophthalmology

## 2020-01-31 DIAGNOSIS — H43813 Vitreous degeneration, bilateral: Secondary | ICD-10-CM

## 2020-01-31 DIAGNOSIS — E11311 Type 2 diabetes mellitus with unspecified diabetic retinopathy with macular edema: Secondary | ICD-10-CM

## 2020-01-31 DIAGNOSIS — E113512 Type 2 diabetes mellitus with proliferative diabetic retinopathy with macular edema, left eye: Secondary | ICD-10-CM | POA: Diagnosis not present

## 2020-01-31 DIAGNOSIS — E113311 Type 2 diabetes mellitus with moderate nonproliferative diabetic retinopathy with macular edema, right eye: Secondary | ICD-10-CM | POA: Diagnosis not present

## 2020-03-13 ENCOUNTER — Encounter (INDEPENDENT_AMBULATORY_CARE_PROVIDER_SITE_OTHER): Payer: BC Managed Care – PPO | Admitting: Ophthalmology

## 2020-03-13 DIAGNOSIS — E113311 Type 2 diabetes mellitus with moderate nonproliferative diabetic retinopathy with macular edema, right eye: Secondary | ICD-10-CM | POA: Diagnosis not present

## 2020-03-13 DIAGNOSIS — E113592 Type 2 diabetes mellitus with proliferative diabetic retinopathy without macular edema, left eye: Secondary | ICD-10-CM

## 2020-03-13 DIAGNOSIS — H43813 Vitreous degeneration, bilateral: Secondary | ICD-10-CM

## 2020-03-13 DIAGNOSIS — E11311 Type 2 diabetes mellitus with unspecified diabetic retinopathy with macular edema: Secondary | ICD-10-CM | POA: Diagnosis not present

## 2020-03-13 DIAGNOSIS — H2513 Age-related nuclear cataract, bilateral: Secondary | ICD-10-CM

## 2020-05-01 ENCOUNTER — Other Ambulatory Visit: Payer: Self-pay

## 2020-05-01 ENCOUNTER — Encounter (INDEPENDENT_AMBULATORY_CARE_PROVIDER_SITE_OTHER): Payer: BC Managed Care – PPO | Admitting: Ophthalmology

## 2020-05-01 DIAGNOSIS — E113311 Type 2 diabetes mellitus with moderate nonproliferative diabetic retinopathy with macular edema, right eye: Secondary | ICD-10-CM | POA: Diagnosis not present

## 2020-05-01 DIAGNOSIS — E11311 Type 2 diabetes mellitus with unspecified diabetic retinopathy with macular edema: Secondary | ICD-10-CM | POA: Diagnosis not present

## 2020-05-01 DIAGNOSIS — E113512 Type 2 diabetes mellitus with proliferative diabetic retinopathy with macular edema, left eye: Secondary | ICD-10-CM | POA: Diagnosis not present

## 2020-05-01 DIAGNOSIS — H43813 Vitreous degeneration, bilateral: Secondary | ICD-10-CM | POA: Diagnosis not present

## 2020-06-12 ENCOUNTER — Other Ambulatory Visit: Payer: Self-pay

## 2020-06-12 ENCOUNTER — Encounter (INDEPENDENT_AMBULATORY_CARE_PROVIDER_SITE_OTHER): Payer: BC Managed Care – PPO | Admitting: Ophthalmology

## 2020-06-12 DIAGNOSIS — E113311 Type 2 diabetes mellitus with moderate nonproliferative diabetic retinopathy with macular edema, right eye: Secondary | ICD-10-CM

## 2020-06-12 DIAGNOSIS — H43813 Vitreous degeneration, bilateral: Secondary | ICD-10-CM

## 2020-06-12 DIAGNOSIS — E113512 Type 2 diabetes mellitus with proliferative diabetic retinopathy with macular edema, left eye: Secondary | ICD-10-CM

## 2020-06-12 DIAGNOSIS — E11311 Type 2 diabetes mellitus with unspecified diabetic retinopathy with macular edema: Secondary | ICD-10-CM

## 2020-06-12 DIAGNOSIS — H2513 Age-related nuclear cataract, bilateral: Secondary | ICD-10-CM

## 2020-07-24 ENCOUNTER — Encounter (INDEPENDENT_AMBULATORY_CARE_PROVIDER_SITE_OTHER): Payer: BC Managed Care – PPO | Admitting: Ophthalmology

## 2020-07-24 ENCOUNTER — Other Ambulatory Visit: Payer: Self-pay

## 2020-07-24 DIAGNOSIS — H2513 Age-related nuclear cataract, bilateral: Secondary | ICD-10-CM

## 2020-07-24 DIAGNOSIS — E113512 Type 2 diabetes mellitus with proliferative diabetic retinopathy with macular edema, left eye: Secondary | ICD-10-CM

## 2020-07-24 DIAGNOSIS — E11311 Type 2 diabetes mellitus with unspecified diabetic retinopathy with macular edema: Secondary | ICD-10-CM | POA: Diagnosis not present

## 2020-07-24 DIAGNOSIS — H43813 Vitreous degeneration, bilateral: Secondary | ICD-10-CM

## 2020-07-24 DIAGNOSIS — E113311 Type 2 diabetes mellitus with moderate nonproliferative diabetic retinopathy with macular edema, right eye: Secondary | ICD-10-CM | POA: Diagnosis not present

## 2020-09-04 ENCOUNTER — Other Ambulatory Visit: Payer: Self-pay

## 2020-09-04 ENCOUNTER — Encounter (INDEPENDENT_AMBULATORY_CARE_PROVIDER_SITE_OTHER): Payer: BC Managed Care – PPO | Admitting: Ophthalmology

## 2020-09-04 DIAGNOSIS — H43813 Vitreous degeneration, bilateral: Secondary | ICD-10-CM | POA: Diagnosis not present

## 2020-09-04 DIAGNOSIS — E11311 Type 2 diabetes mellitus with unspecified diabetic retinopathy with macular edema: Secondary | ICD-10-CM | POA: Diagnosis not present

## 2020-09-04 DIAGNOSIS — E113592 Type 2 diabetes mellitus with proliferative diabetic retinopathy without macular edema, left eye: Secondary | ICD-10-CM | POA: Diagnosis not present

## 2020-09-04 DIAGNOSIS — E113311 Type 2 diabetes mellitus with moderate nonproliferative diabetic retinopathy with macular edema, right eye: Secondary | ICD-10-CM | POA: Diagnosis not present

## 2020-10-16 ENCOUNTER — Encounter (INDEPENDENT_AMBULATORY_CARE_PROVIDER_SITE_OTHER): Payer: BC Managed Care – PPO | Admitting: Ophthalmology

## 2020-10-16 ENCOUNTER — Other Ambulatory Visit: Payer: Self-pay

## 2020-10-16 DIAGNOSIS — H43813 Vitreous degeneration, bilateral: Secondary | ICD-10-CM

## 2020-10-16 DIAGNOSIS — E11311 Type 2 diabetes mellitus with unspecified diabetic retinopathy with macular edema: Secondary | ICD-10-CM

## 2020-10-16 DIAGNOSIS — E113311 Type 2 diabetes mellitus with moderate nonproliferative diabetic retinopathy with macular edema, right eye: Secondary | ICD-10-CM

## 2020-10-16 DIAGNOSIS — E113592 Type 2 diabetes mellitus with proliferative diabetic retinopathy without macular edema, left eye: Secondary | ICD-10-CM

## 2020-11-21 ENCOUNTER — Encounter (INDEPENDENT_AMBULATORY_CARE_PROVIDER_SITE_OTHER): Payer: BC Managed Care – PPO | Admitting: Ophthalmology

## 2020-11-21 ENCOUNTER — Other Ambulatory Visit: Payer: Self-pay

## 2020-11-21 DIAGNOSIS — E11311 Type 2 diabetes mellitus with unspecified diabetic retinopathy with macular edema: Secondary | ICD-10-CM | POA: Diagnosis not present

## 2020-11-21 DIAGNOSIS — H43813 Vitreous degeneration, bilateral: Secondary | ICD-10-CM | POA: Diagnosis not present

## 2020-11-21 DIAGNOSIS — E113311 Type 2 diabetes mellitus with moderate nonproliferative diabetic retinopathy with macular edema, right eye: Secondary | ICD-10-CM

## 2020-11-21 DIAGNOSIS — E113512 Type 2 diabetes mellitus with proliferative diabetic retinopathy with macular edema, left eye: Secondary | ICD-10-CM | POA: Diagnosis not present

## 2020-12-26 ENCOUNTER — Encounter (INDEPENDENT_AMBULATORY_CARE_PROVIDER_SITE_OTHER): Payer: BC Managed Care – PPO | Admitting: Ophthalmology

## 2020-12-26 ENCOUNTER — Other Ambulatory Visit: Payer: Self-pay

## 2020-12-26 DIAGNOSIS — E113311 Type 2 diabetes mellitus with moderate nonproliferative diabetic retinopathy with macular edema, right eye: Secondary | ICD-10-CM | POA: Diagnosis not present

## 2020-12-26 DIAGNOSIS — H43813 Vitreous degeneration, bilateral: Secondary | ICD-10-CM | POA: Diagnosis not present

## 2020-12-26 DIAGNOSIS — E113592 Type 2 diabetes mellitus with proliferative diabetic retinopathy without macular edema, left eye: Secondary | ICD-10-CM | POA: Diagnosis not present

## 2021-01-30 ENCOUNTER — Encounter (INDEPENDENT_AMBULATORY_CARE_PROVIDER_SITE_OTHER): Payer: BC Managed Care – PPO | Admitting: Ophthalmology

## 2021-01-31 ENCOUNTER — Encounter (INDEPENDENT_AMBULATORY_CARE_PROVIDER_SITE_OTHER): Payer: BC Managed Care – PPO | Admitting: Ophthalmology

## 2021-02-06 ENCOUNTER — Other Ambulatory Visit: Payer: Self-pay

## 2021-02-06 ENCOUNTER — Encounter (INDEPENDENT_AMBULATORY_CARE_PROVIDER_SITE_OTHER): Payer: BC Managed Care – PPO | Admitting: Ophthalmology

## 2021-02-06 DIAGNOSIS — H43813 Vitreous degeneration, bilateral: Secondary | ICD-10-CM

## 2021-02-06 DIAGNOSIS — E113311 Type 2 diabetes mellitus with moderate nonproliferative diabetic retinopathy with macular edema, right eye: Secondary | ICD-10-CM | POA: Diagnosis not present

## 2021-02-06 DIAGNOSIS — E113512 Type 2 diabetes mellitus with proliferative diabetic retinopathy with macular edema, left eye: Secondary | ICD-10-CM | POA: Diagnosis not present

## 2021-03-14 ENCOUNTER — Encounter (INDEPENDENT_AMBULATORY_CARE_PROVIDER_SITE_OTHER): Payer: BC Managed Care – PPO | Admitting: Ophthalmology

## 2021-03-14 ENCOUNTER — Other Ambulatory Visit: Payer: Self-pay

## 2021-03-14 DIAGNOSIS — E113512 Type 2 diabetes mellitus with proliferative diabetic retinopathy with macular edema, left eye: Secondary | ICD-10-CM

## 2021-03-14 DIAGNOSIS — E113311 Type 2 diabetes mellitus with moderate nonproliferative diabetic retinopathy with macular edema, right eye: Secondary | ICD-10-CM

## 2021-03-14 DIAGNOSIS — H43813 Vitreous degeneration, bilateral: Secondary | ICD-10-CM

## 2021-04-18 ENCOUNTER — Encounter (INDEPENDENT_AMBULATORY_CARE_PROVIDER_SITE_OTHER): Payer: BC Managed Care – PPO | Admitting: Ophthalmology

## 2021-04-25 ENCOUNTER — Encounter (INDEPENDENT_AMBULATORY_CARE_PROVIDER_SITE_OTHER): Payer: BC Managed Care – PPO | Admitting: Ophthalmology

## 2021-04-25 ENCOUNTER — Other Ambulatory Visit: Payer: Self-pay

## 2021-04-25 DIAGNOSIS — E113311 Type 2 diabetes mellitus with moderate nonproliferative diabetic retinopathy with macular edema, right eye: Secondary | ICD-10-CM | POA: Diagnosis not present

## 2021-04-25 DIAGNOSIS — H43813 Vitreous degeneration, bilateral: Secondary | ICD-10-CM | POA: Diagnosis not present

## 2021-04-25 DIAGNOSIS — E113512 Type 2 diabetes mellitus with proliferative diabetic retinopathy with macular edema, left eye: Secondary | ICD-10-CM | POA: Diagnosis not present

## 2021-06-02 ENCOUNTER — Encounter (INDEPENDENT_AMBULATORY_CARE_PROVIDER_SITE_OTHER): Payer: BC Managed Care – PPO | Admitting: Ophthalmology

## 2021-06-06 ENCOUNTER — Other Ambulatory Visit: Payer: Self-pay

## 2021-06-06 ENCOUNTER — Encounter (INDEPENDENT_AMBULATORY_CARE_PROVIDER_SITE_OTHER): Payer: BC Managed Care – PPO | Admitting: Ophthalmology

## 2021-06-06 DIAGNOSIS — E113512 Type 2 diabetes mellitus with proliferative diabetic retinopathy with macular edema, left eye: Secondary | ICD-10-CM

## 2021-06-06 DIAGNOSIS — E113311 Type 2 diabetes mellitus with moderate nonproliferative diabetic retinopathy with macular edema, right eye: Secondary | ICD-10-CM | POA: Diagnosis not present

## 2021-06-06 DIAGNOSIS — H43813 Vitreous degeneration, bilateral: Secondary | ICD-10-CM

## 2021-07-18 ENCOUNTER — Other Ambulatory Visit: Payer: Self-pay

## 2021-07-18 ENCOUNTER — Encounter (INDEPENDENT_AMBULATORY_CARE_PROVIDER_SITE_OTHER): Payer: BC Managed Care – PPO | Admitting: Ophthalmology

## 2021-07-18 DIAGNOSIS — H2513 Age-related nuclear cataract, bilateral: Secondary | ICD-10-CM | POA: Diagnosis not present

## 2021-07-18 DIAGNOSIS — E113311 Type 2 diabetes mellitus with moderate nonproliferative diabetic retinopathy with macular edema, right eye: Secondary | ICD-10-CM

## 2021-07-18 DIAGNOSIS — E113512 Type 2 diabetes mellitus with proliferative diabetic retinopathy with macular edema, left eye: Secondary | ICD-10-CM | POA: Diagnosis not present

## 2021-07-18 DIAGNOSIS — H43813 Vitreous degeneration, bilateral: Secondary | ICD-10-CM

## 2021-08-29 ENCOUNTER — Encounter (INDEPENDENT_AMBULATORY_CARE_PROVIDER_SITE_OTHER): Payer: BC Managed Care – PPO | Admitting: Ophthalmology

## 2021-08-29 ENCOUNTER — Other Ambulatory Visit: Payer: Self-pay

## 2021-08-29 DIAGNOSIS — E113592 Type 2 diabetes mellitus with proliferative diabetic retinopathy without macular edema, left eye: Secondary | ICD-10-CM

## 2021-08-29 DIAGNOSIS — H43813 Vitreous degeneration, bilateral: Secondary | ICD-10-CM | POA: Diagnosis not present

## 2021-08-29 DIAGNOSIS — E113311 Type 2 diabetes mellitus with moderate nonproliferative diabetic retinopathy with macular edema, right eye: Secondary | ICD-10-CM | POA: Diagnosis not present

## 2021-10-09 ENCOUNTER — Encounter (INDEPENDENT_AMBULATORY_CARE_PROVIDER_SITE_OTHER): Payer: BC Managed Care – PPO | Admitting: Ophthalmology

## 2021-10-09 ENCOUNTER — Other Ambulatory Visit: Payer: Self-pay

## 2021-10-09 DIAGNOSIS — E113311 Type 2 diabetes mellitus with moderate nonproliferative diabetic retinopathy with macular edema, right eye: Secondary | ICD-10-CM

## 2021-10-09 DIAGNOSIS — H43813 Vitreous degeneration, bilateral: Secondary | ICD-10-CM

## 2021-10-09 DIAGNOSIS — E113512 Type 2 diabetes mellitus with proliferative diabetic retinopathy with macular edema, left eye: Secondary | ICD-10-CM | POA: Diagnosis not present

## 2021-11-21 ENCOUNTER — Other Ambulatory Visit: Payer: Self-pay

## 2021-11-21 ENCOUNTER — Encounter (INDEPENDENT_AMBULATORY_CARE_PROVIDER_SITE_OTHER): Payer: BC Managed Care – PPO | Admitting: Ophthalmology

## 2021-11-21 DIAGNOSIS — H43813 Vitreous degeneration, bilateral: Secondary | ICD-10-CM | POA: Diagnosis not present

## 2021-11-21 DIAGNOSIS — E113592 Type 2 diabetes mellitus with proliferative diabetic retinopathy without macular edema, left eye: Secondary | ICD-10-CM | POA: Diagnosis not present

## 2021-11-21 DIAGNOSIS — E113311 Type 2 diabetes mellitus with moderate nonproliferative diabetic retinopathy with macular edema, right eye: Secondary | ICD-10-CM | POA: Diagnosis not present

## 2021-12-12 ENCOUNTER — Other Ambulatory Visit: Payer: Self-pay | Admitting: Neurosurgery

## 2021-12-19 ENCOUNTER — Encounter (INDEPENDENT_AMBULATORY_CARE_PROVIDER_SITE_OTHER): Payer: BC Managed Care – PPO | Admitting: Ophthalmology

## 2021-12-26 ENCOUNTER — Other Ambulatory Visit: Payer: Self-pay | Admitting: Neurosurgery

## 2022-01-02 ENCOUNTER — Other Ambulatory Visit: Payer: Self-pay

## 2022-01-02 ENCOUNTER — Encounter (INDEPENDENT_AMBULATORY_CARE_PROVIDER_SITE_OTHER): Payer: BC Managed Care – PPO | Admitting: Ophthalmology

## 2022-01-02 DIAGNOSIS — E113592 Type 2 diabetes mellitus with proliferative diabetic retinopathy without macular edema, left eye: Secondary | ICD-10-CM

## 2022-01-02 DIAGNOSIS — E113311 Type 2 diabetes mellitus with moderate nonproliferative diabetic retinopathy with macular edema, right eye: Secondary | ICD-10-CM

## 2022-01-02 DIAGNOSIS — H43813 Vitreous degeneration, bilateral: Secondary | ICD-10-CM | POA: Diagnosis not present

## 2022-01-23 NOTE — Progress Notes (Signed)
Surgical Instructions    Your procedure is scheduled on 01/28/22.  Report to [facility] Main Entrance "A" at 10:30 A.M., then check in with the Admitting office.  Call this number if you have problems the morning of surgery:  [phone]   If you have any questions prior to your surgery date call [phone]: Open Monday-Friday 8am-4pm    Remember:  Do not eat or drink after midnight the night before your surgery      Take these medicines the morning of surgery with A SIP OF WATER:  atorvastatin (LIPITOR) isosorbide mononitrate (IMDUR) pantoprazole (PROTONIX)   As of today, STOP taking any Aspirin (unless otherwise instructed by your surgeon) Aleve, Naproxen, Ibuprofen, Motrin, Advil, Goody's, BC's, all herbal medications, fish oil, and all vitamins.  WHAT DO I DO ABOUT MY DIABETES MEDICATION?   Do not take oral diabetes medicines (pills) the morning of surgery DO NOT take JARDIANCE the day prior to surgery or the morning of surgery DO NOT take RYBELSUS the morning of surgery.  THE NIGHT BEFORE SURGERY, take _20_ units (50%) of ____Toujeo_______insulin.       THE MORNING OF SURGERY, take __20___ units  (50%) of ____Toujeo______insulin.  The day of surgery, do not take other diabetes injectables, including Byetta (exenatide), Bydureon (exenatide ER), Victoza (liraglutide), or Trulicity (dulaglutide).  If your CBG is greater than 220 mg/dL, you may take  of your sliding scale (correction) dose of  FIASP insulin   HOW TO MANAGE YOUR DIABETES BEFORE AND AFTER SURGERY  Why is it important to control my blood sugar before and after surgery? Improving blood sugar levels before and after surgery helps healing and can limit problems. A way of improving blood sugar control is eating a healthy diet by:  Eating less sugar and carbohydrates  Increasing activity/exercise  Talking with your doctor about reaching your blood sugar goals High blood sugars (greater than 180 mg/dL)  can raise your risk of infections and slow your recovery, so you will need to focus on controlling your diabetes during the weeks before surgery. Make sure that the doctor who takes care of your diabetes knows about your planned surgery including the date and location.  How do I manage my blood sugar before surgery? Check your blood sugar at least 4 times a day, starting 2 days before surgery, to make sure that the level is not too high or low.  Check your blood sugar the morning of your surgery when you wake up and every 2 hours until you get to the Short Stay unit.  If your blood sugar is less than 70 mg/dL, you will need to treat for low blood sugar: Do not take insulin. Treat a low blood sugar (less than 70 mg/dL) with  cup of clear juice (cranberry or apple), 4 glucose tablets, OR glucose gel. Recheck blood sugar in 15 minutes after treatment (to make sure it is greater than 70 mg/dL). If your blood sugar is not greater than 70 mg/dL on recheck, call [phone] for further instructions. Report your blood sugar to the short stay nurse when you get to Short Stay.  If you are admitted to the hospital after surgery: Your blood sugar will be checked by the staff and you will probably be given insulin after surgery (instead of oral diabetes medicines) to make sure you have good blood sugar levels. The goal for blood sugar control after surgery is 80-180 mg/dL.            Do not  wear jewelry  Do not wear lotions, powders, colognes, or deodorant. Do not shave 48 hours prior to surgery.  Men may shave face and neck. Do not bring valuables to the hospital.   [facility] is not responsible for any belongings or valuables. .   Do NOT Smoke (Tobacco/Vaping)  24 hours prior to your procedure  If you use a CPAP at night, you may bring your mask for your overnight stay.   Contacts, glasses, hearing aids, dentures or partials may not be worn into surgery, please bring cases for these  belongings   For patients admitted to the hospital, discharge time will be determined by your treatment team.   Patients discharged the day of surgery will not be allowed to drive home, and someone needs to stay with them for 24 hours.  NO VISITORS WILL BE ALLOWED IN PRE-OP WHERE PATIENTS ARE PREPPED FOR SURGERY.  ONLY 1 SUPPORT PERSON MAY BE PRESENT IN THE WAITING ROOM WHILE YOU ARE IN SURGERY.  IF YOU ARE TO BE ADMITTED, ONCE YOU ARE IN YOUR ROOM YOU WILL BE ALLOWED TWO (2) VISITORS. 1 (ONE) VISITOR MAY STAY OVERNIGHT BUT MUST ARRIVE TO THE ROOM BY 8pm.  Minor children may have two parents present. Special consideration for safety and communication needs will be reviewed on a case by case basis.  Special instructions:    Oral Hygiene is also important to reduce your risk of infection.  Remember - BRUSH YOUR TEETH THE MORNING OF SURGERY WITH YOUR REGULAR TOOTHPASTE   [facility]- Preparing For Surgery  Before surgery, you can play an important role. Because skin is not sterile, your skin needs to be as free of germs as possible. You can reduce the number of germs on your skin by washing with CHG (chlorahexidine gluconate) Soap before surgery.  CHG is an antiseptic cleaner which kills germs and bonds with the skin to continue killing germs even after washing.     Please do not use if you have an allergy to CHG or antibacterial soaps. If your skin becomes reddened/irritated stop using the CHG.  Do not shave (including legs and underarms) for at least 48 hours prior to first CHG shower. It is OK to shave your face.  Please follow these instructions carefully.     Shower the NIGHT BEFORE SURGERY and the MORNING OF SURGERY with CHG Soap.   If you chose to wash your hair, wash your hair first as usual with your normal shampoo. After you shampoo, rinse your hair and body thoroughly to remove the shampoo.  Then [employer] and genitals (private parts) with your normal soap and rinse thoroughly to  remove soap.  After that Use CHG Soap as you would any other liquid soap. You can apply CHG directly to the skin and wash gently with a scrungie or a clean washcloth.   Apply the CHG Soap to your body ONLY FROM THE NECK DOWN.  Do not use on open wounds or open sores. Avoid contact with your eyes, ears, mouth and genitals (private parts). Wash Face and genitals (private parts)  with your normal soap.   Wash thoroughly, paying special attention to the area where your surgery will be performed.  Thoroughly rinse your body with warm water from the neck down.  DO NOT shower/wash with your normal soap after using and rinsing off the CHG Soap.  Pat yourself dry with a CLEAN TOWEL.  Wear CLEAN PAJAMAS to bed the night before surgery  Place CLEAN  SHEETS on your bed the night before your surgery  DO NOT SLEEP WITH PETS.   Day of Surgery:  Take a shower with CHG soap. Wear Clean/Comfortable clothing the morning of surgery Do not apply any deodorants/lotions.   Remember to brush your teeth WITH YOUR REGULAR TOOTHPASTE.    COVID testing  If you are going to stay overnight or be admitted after your procedure/surgery and require a pre-op COVID test, please follow these instructions after your COVID test   You are not required to quarantine however you are required to wear a well-fitting mask when you are out and around people not in your household.  If your mask becomes wet or soiled, replace with a new one.  Wash your hands often with soap and water for 20 seconds or clean your hands with an alcohol-based hand sanitizer that contains at least 60% alcohol.  Do not share personal items.  Notify your provider: if you are in close contact with someone who has COVID  or if you develop a fever of 100.4 or greater, sneezing, cough, sore throat, shortness of breath or body aches.    Please read over the following fact sheets that you were given.

## 2022-01-26 ENCOUNTER — Encounter (HOSPITAL_COMMUNITY): Payer: Self-pay

## 2022-01-26 ENCOUNTER — Other Ambulatory Visit: Payer: Self-pay

## 2022-01-26 ENCOUNTER — Other Ambulatory Visit (HOSPITAL_COMMUNITY): Payer: BLUE CROSS/BLUE SHIELD

## 2022-01-26 ENCOUNTER — Encounter (HOSPITAL_COMMUNITY)
Admission: RE | Admit: 2022-01-26 | Discharge: 2022-01-26 | Disposition: A | Discharge status: Home / Self Care | Payer: BC Managed Care – PPO | Source: Ambulatory Visit | Attending: Neurosurgery | Admitting: Neurosurgery

## 2022-01-26 VITALS — BP 110/68 | HR 78 | Temp 98.2°F | Resp 18 | Ht 71.0 in | Wt 186.0 lb

## 2022-01-26 DIAGNOSIS — I2510 Atherosclerotic heart disease of native coronary artery without angina pectoris: Secondary | ICD-10-CM | POA: Diagnosis not present

## 2022-01-26 DIAGNOSIS — E119 Type 2 diabetes mellitus without complications: Secondary | ICD-10-CM | POA: Diagnosis not present

## 2022-01-26 DIAGNOSIS — R944 Abnormal results of kidney function studies: Secondary | ICD-10-CM | POA: Diagnosis not present

## 2022-01-26 DIAGNOSIS — Z01818 Encounter for other preprocedural examination: Secondary | ICD-10-CM | POA: Insufficient documentation

## 2022-01-26 DIAGNOSIS — Z7982 Long term (current) use of aspirin: Secondary | ICD-10-CM | POA: Insufficient documentation

## 2022-01-26 DIAGNOSIS — Z79899 Other long term (current) drug therapy: Secondary | ICD-10-CM | POA: Diagnosis not present

## 2022-01-26 DIAGNOSIS — Z794 Long term (current) use of insulin: Secondary | ICD-10-CM | POA: Diagnosis not present

## 2022-01-26 HISTORY — DX: Unspecified osteoarthritis, unspecified site: M19.90

## 2022-01-26 HISTORY — DX: Sleep apnea, unspecified: G47.30

## 2022-01-26 LAB — BASIC METABOLIC PANEL
Anion gap: 8 (ref 5–15)
BUN: 18 mg/dL (ref 6–20)
CO2: 23 mmol/L (ref 22–32)
Calcium: 8.7 mg/dL — ABNORMAL LOW (ref 8.9–10.3)
Chloride: 109 mmol/L (ref 98–111)
Creatinine, Ser: 1.36 mg/dL — ABNORMAL HIGH (ref 0.61–1.24)
GFR, Estimated: >60 mL/min (ref >60)
Glucose, Bld: 133 mg/dL — ABNORMAL HIGH (ref 70–99)
Potassium: 3.8 mmol/L (ref 3.5–5.1)
Sodium: 140 mmol/L (ref 135–145)

## 2022-01-26 LAB — TYPE AND SCREEN
ABO/RH(D): O POS
Antibody Screen: NEGATIVE

## 2022-01-26 LAB — CBC
HCT: 44.3 % (ref 39.0–52.0)
Hemoglobin: 14.6 g/dL (ref 13.0–17.0)
MCH: 29.3 pg (ref 26.0–34.0)
MCHC: 33 g/dL (ref 30.0–36.0)
MCV: 88.8 fL (ref 80.0–100.0)
Platelets: 233 10*3/uL (ref 150–400)
RBC: 4.99 MIL/uL (ref 4.22–5.81)
RDW: 11.8 % (ref 11.5–15.5)
WBC: 4 10*3/uL (ref 4.0–10.5)
nRBC: 0 % (ref 0.0–0.2)

## 2022-01-26 LAB — SURGICAL PCR SCREEN
MRSA, PCR: POSITIVE — AB
Staphylococcus aureus: POSITIVE — AB

## 2022-01-26 LAB — GLUCOSE, CAPILLARY: Glucose-Capillary: 150 mg/dL — ABNORMAL HIGH (ref 70–99)

## 2022-01-26 LAB — HEMOGLOBIN A1C
Hgb A1c MFr Bld: 7.6 % — ABNORMAL HIGH (ref 4.8–5.6)
Mean Plasma Glucose: 171 mg/dL

## 2022-01-26 NOTE — Progress Notes (Addendum)
PCP - Lorelei Pont, DO Cardiologist - denies  PPM/ICD - denies Device Orders - n/a Rep Notified - n/a  Chest x-ray - 01/02/2019 EKG - 01/26/2022 Stress Test - denies ECHO - 01/03/2019 Cardiac Cath - denies  Sleep Study - yes, positive for OSA CPAP - yes  Fasting Blood Sugar - 105 average (patient has continue CBG monitor CBG today - 150 A1C - done in PAT on 01/26/2022  Blood Thinner Instructions: n/a  Aspirin Instructions: Patient was instructed: As of today, STOP taking any Aspirin (unless otherwise instructed by your surgeon) Aleve, Naproxen, Ibuprofen, Motrin, Advil, Goody's, BC's, all herbal medications, fish oil, and all vitamins.  ERAS Protcol - n/a   COVID TEST- no, patient was tested positive on 12/16/2021 at [facility]  Anesthesia review: yes - abnormal EKG in PAT; patient is on Imdur and Telmisartan but he denied HTN  Patient denies shortness of breath, fever, cough and chest pain at PAT appointment   All instructions explained to the patient, with a verbal understanding of the material. Patient agrees to go over the instructions while at home for a better understanding. Patient also instructed to self quarantine after being tested for COVID-19. The opportunity to ask questions was provided.

## 2022-01-27 NOTE — H&P (Addendum)
CC: back and right leg pain  HPI:     Patient is a 61 y.o. male with hx of right L3-4 posterior decompression developed recurrent right leg radiculopathy.  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  PT and injections failed to help his pain. He presents for elective TLIF.    There are no problems to display for this patient.  Past Medical History:  Diagnosis Date   Arthritis    Diabetes mellitus without complication (HCC)    Renal cyst    Sleep apnea     Past Surgical History:  Procedure Laterality Date   APPENDECTOMY     BACK SURGERY     CHOLECYSTECTOMY     MEDIAL COLLATERAL LIGAMENT REPAIR, ELBOW     SINUS SURGERY WITH INSTATRAK      No medications prior to admission.   Allergies  Allergen Reactions   Bee Venom Swelling    Social History   Tobacco Use   Smoking status: Never   Smokeless tobacco: Never  Substance Use Topics   Alcohol use: Never    Family History  Problem Relation Age of Onset   Diabetes Mother    Glaucoma Father      Review of Systems Pertinent items are noted in HPI.  Objective:   No data found. No intake/output data recorded. No intake/output data recorded.      General : Alert, cooperative, no distress, appears stated age   Head:  Normocephalic/atraumatic    Eyes: PERRL, conjunctiva/corneas clear, EOM's intact. Fundi could not be visualized Neck: Supple Chest:  Respirations unlabored Chest wall: no tenderness or deformity Heart: Regular rate and rhythm Abdomen: Soft, nontender and nondistended Extremities: warm and well-perfused Skin: normal turgor, color and texture Neurologic:  Alert, oriented x 3.  Eyes open spontaneously. PERRL, EOMI, VFC, no facial droop. V1-3 intact.  No dysarthria, tongue protrusion symmetric.  CNII-XII intact. Normal strength, sensation and reflexes throughout.  No pronator drift, full strength in legs.  Positive R SLR.  Well healed lumbar incision.        Data ReviewRadiology review:  See clinic note for details  Assessment:   Active Problems:   * No active hospital problems. * 61 yo M with hx of R L3-4 decompression with recurrent radiculopathy and stenosis with instability  Plan:   - R L3-4 MIS TLIF -  I discussed in detail the procedure with the patient.  Risks, benefits, alternatives, and expected convalescence was discussed.  Risks discussed included but were not limited to bleeding, pain, infection, scar, recurrence, instability, pseudoarthrosis, cerebrospinal fluid leak, neurologic deficit, paralysis and death.  I also discussed with her the possibility of blood transfusion during surgery and consent to transfuse blood products if necessary was also obtained.  All questions and concerns were answered and understanding and agreement with the plan was verbalized.

## 2022-01-27 NOTE — Progress Notes (Signed)
Anesthesia Chart Review:  In January 2020 patient was admitted at [facility] for chest pain.  He underwent extensive cardiac work-up which was negative for ischemic or structural heart disease, pulmonary disease including PE and pneumonia, and aortic dissection.  Most likely etiology was felt to be underlying esophagitis or esophageal spasm.  Per discharge summary 01/02/2019, "given concern for underlying esophageal etiology of his chest pain, patient continued on daily PPI. May benefit from GI evaluation as an outpatient for EGD. Left heart cath showing mild CAD. Patient discharged with daily ASA 81 mg. Continued on Losartan and Atorvastatin. Consistent bradycardia while inpatient, but patient asymptomatic and in sinus rhythm. Avoid Beta-Blockers. FYI- Provided patient with prescription for Imdur given some relief of his pain while taking this. This is not prescribed for underlying large vessel coronary disease. 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  A1c 7.6 on preop labs.  Preop labs reviewed, creatinine mildly elevated 1.36, otherwise unremarkable.  EKG 01/26/2022: Sinus rhythm with sinus arrhythmia with frequent Premature ventricular complexes.  Rate 68.  TTE 01/03/2019 (Care Everywhere):  Normal left ventricular systolic function, ejection fraction 60 to 65%   Normal right ventricular systolic function   No significant valvular abnormalities  Cath 01/02/2019 (Care Everywhere): CONCLUSIONS:   Mild coronary disease most prominent in the circumflex and large  marginal up to 40 to 50% also in a 40% ostial diagonal   Preserved left ventricular systolic function with ejection fraction  greater than 60%   Normal left ventricular end-diastolic pressure   INDICATION: 61 year old with chest pain symptoms for further evaluation      Wynonia Musty [facility] Short Stay Center/Anesthesiology Phone [phone] 01/27/2022 10:07 AM

## 2022-01-27 NOTE — Anesthesia Preprocedure Evaluation (Addendum)
Anesthesia Evaluation  Patient identified by MRN, date of birth, ID band Patient awake    Reviewed: Allergy & Precautions, NPO status , Patient's Chart, lab work & pertinent test results  Airway Mallampati: II  TM Distance: >3 FB Neck ROM: Full    Dental  (+) Teeth Intact, Dental Advisory Given   Pulmonary sleep apnea and Continuous Positive Airway Pressure Ventilation ,    Pulmonary exam normal breath sounds clear to auscultation       Cardiovascular hypertension, Pt. on medications Normal cardiovascular exam Rhythm:Regular Rate:Normal     Neuro/Psych SPONDYLOLISTHESIS, LUMBAR REGION negative psych ROS   GI/Hepatic Neg liver ROS, GERD  Medicated,  Endo/Other  diabetes, Type 2, Oral Hypoglycemic Agents, Insulin Dependent  Renal/GU negative Renal ROS     Musculoskeletal  (+) Arthritis ,   Abdominal   Peds  Hematology negative hematology ROS (+)   Anesthesia Other Findings Day of surgery medications reviewed with the patient.  Reproductive/Obstetrics                            Anesthesia Physical Anesthesia Plan  ASA: 2  Anesthesia Plan: General   Post-op Pain Management: Tylenol PO (pre-op)*   Induction: Intravenous  PONV Risk Score and Plan: 2 and Midazolam, Dexamethasone and Ondansetron  Airway Management Planned: Oral ETT  Additional Equipment:   Intra-op Plan:   Post-operative Plan: Extubation in OR  Informed Consent: I have reviewed the patients History and Physical, chart, labs and discussed the procedure including the risks, benefits and alternatives for the proposed anesthesia with the patient or authorized representative who has indicated his/her understanding and acceptance.     Dental advisory given  Plan Discussed with: CRNA  Anesthesia Plan Comments: (PAT note by Karoline Caldwell, PA-C: In January 2020 patient was admitted at [facility] for chest pain.  He underwent  extensive cardiac work-up which was negative for ischemic or structural heart disease, pulmonary disease including PE and pneumonia, and aortic dissection.  Most likely etiology was felt to be underlying esophagitis or esophageal spasm.  Per discharge summary 01/02/2019, "given concern for underlying esophageal etiology of his chest pain, patient continued on daily PPI. May benefit from GI evaluation as an outpatient for EGD. Left heart cath showing mild CAD. Patient discharged with daily ASA 81 mg. Continued on Losartan and Atorvastatin. Consistent bradycardia while inpatient, but patient asymptomatic and in sinus rhythm. Avoid Beta-Blockers. FYI- Provided patient with prescription for Imdur given some relief of his pain while taking this. This is not prescribed for underlying large vessel coronary disease. Likely effect is on esophageal relaxation."  Follows with endocrinology at [facility] for IDDM 2.  A1c 7.6 on preop labs.  Preop labs reviewed, creatinine mildly elevated 1.36, otherwise unremarkable.  EKG 01/26/2022: Sinus rhythm with sinus arrhythmia with frequent Premature ventricular complexes.  Rate 68.  TTE 01/03/2019 (Care Everywhere):  Normal left ventricular systolic function, ejection fraction 60 to 65%   Normal right ventricular systolic function   No significant valvular abnormalities  Cath 01/02/2019 (Care Everywhere): CONCLUSIONS:   Mild coronary disease most prominent in the circumflex and large  marginal up to 40 to 50% also in a 40% ostial diagonal   Preserved left ventricular systolic function with ejection fraction  greater than 60%   Normal left ventricular end-diastolic pressure   INDICATION: 61 year old with chest pain symptoms for further evaluation   )       Anesthesia Quick Evaluation

## 2022-01-28 ENCOUNTER — Encounter (HOSPITAL_COMMUNITY)
Admission: RE | Disposition: A | Discharge status: Home / Self Care | Payer: Self-pay | Source: Home / Self Care | Attending: Neurosurgery

## 2022-01-28 ENCOUNTER — Observation Stay (HOSPITAL_COMMUNITY)
Admission: RE | Admit: 2022-01-28 | Discharge: 2022-01-29 | Disposition: A | Discharge status: Home / Self Care | Payer: BC Managed Care – PPO | Attending: Neurosurgery | Admitting: Neurosurgery

## 2022-01-28 ENCOUNTER — Ambulatory Visit (HOSPITAL_COMMUNITY): Payer: BC Managed Care – PPO | Admitting: Physician Assistant

## 2022-01-28 ENCOUNTER — Other Ambulatory Visit: Payer: Self-pay

## 2022-01-28 ENCOUNTER — Ambulatory Visit (HOSPITAL_COMMUNITY): Payer: BC Managed Care – PPO

## 2022-01-28 ENCOUNTER — Ambulatory Visit (HOSPITAL_COMMUNITY): Payer: BC Managed Care – PPO | Admitting: Anesthesiology

## 2022-01-28 ENCOUNTER — Encounter (HOSPITAL_COMMUNITY): Payer: Self-pay

## 2022-01-28 DIAGNOSIS — Z419 Encounter for procedure for purposes other than remedying health state, unspecified: Secondary | ICD-10-CM

## 2022-01-28 DIAGNOSIS — M4316 Spondylolisthesis, lumbar region: Principal | ICD-10-CM | POA: Insufficient documentation

## 2022-01-28 DIAGNOSIS — M5416 Radiculopathy, lumbar region: Secondary | ICD-10-CM | POA: Diagnosis not present

## 2022-01-28 DIAGNOSIS — E119 Type 2 diabetes mellitus without complications: Secondary | ICD-10-CM | POA: Diagnosis not present

## 2022-01-28 HISTORY — PX: TRANSFORAMINAL LUMBAR INTERBODY FUSION W/ MIS 1 LEVEL: SHX6145

## 2022-01-28 LAB — GLUCOSE, CAPILLARY
Glucose-Capillary: 115 mg/dL — ABNORMAL HIGH (ref 70–99)
Glucose-Capillary: 113 mg/dL — ABNORMAL HIGH (ref 70–99)
Glucose-Capillary: 136 mg/dL — ABNORMAL HIGH (ref 70–99)

## 2022-01-28 LAB — ABO/RH: ABO/RH(D): O POS

## 2022-01-28 SURGERY — MINIMALLY INVASIVE (MIS) TRANSFORAMINAL LUMBAR INTERBODY FUSION (TLIF) 1 LEVEL
Anesthesia: General | Site: Spine Lumbar | Laterality: Right

## 2022-01-28 MED ORDER — SODIUM CHLORIDE 0.9% FLUSH: 3.0000 mL | INTRAVENOUS | Status: DC | PRN

## 2022-01-28 MED ORDER — THROMBIN 5000 UNITS EX SOLR: OROMUCOSAL | Status: DC | PRN

## 2022-01-28 MED ORDER — DOCUSATE SODIUM 100 MG PO CAPS
100.0000 mg | ORAL_CAPSULE | Freq: Two times a day (BID) | ORAL | Status: DC
  Administered 2022-01-28: 100 mg via ORAL
  Filled 2022-01-28: qty 1

## 2022-01-28 MED ORDER — INSULIN GLARGINE (1 UNIT DIAL) 300 UNIT/ML ~~LOC~~ SOPN: 40.0000 [IU] | PEN_INJECTOR | Freq: Two times a day (BID) | SUBCUTANEOUS | Status: DC

## 2022-01-28 MED ORDER — FLEET ENEMA 7-19 GM/118ML RE ENEM: 1.0000 | ENEMA | Freq: Once | RECTAL | Status: DC | PRN

## 2022-01-28 MED ORDER — BUPIVACAINE LIPOSOME 1.3 % IJ SUSP
INTRAMUSCULAR | Status: DC | PRN
  Administered 2022-01-28: 40 mL

## 2022-01-28 MED ORDER — ISOSORBIDE MONONITRATE ER 30 MG PO TB24
30.0000 mg | ORAL_TABLET | Freq: Every day | ORAL | Status: DC
  Filled 2022-01-28 (×2): qty 1

## 2022-01-28 MED ORDER — LIDOCAINE-EPINEPHRINE 1 %-1:100000 IJ SOLN
INTRAMUSCULAR | Status: DC | PRN
  Administered 2022-01-28: 10 mL

## 2022-01-28 MED ORDER — ONDANSETRON HCL 4 MG/2ML IJ SOLN
INTRAMUSCULAR | Status: DC | PRN
  Administered 2022-01-28: 4 mg via INTRAVENOUS

## 2022-01-28 MED ORDER — MENTHOL 3 MG MT LOZG: 1.0000 | LOZENGE | OROMUCOSAL | Status: DC | PRN

## 2022-01-28 MED ORDER — INSULIN ASPART 100 UNIT/ML IJ SOLN: 0.0000 [IU] | Freq: Three times a day (TID) | INTRAMUSCULAR | Status: DC

## 2022-01-28 MED ORDER — LACTATED RINGERS IV SOLN: INTRAVENOUS | Status: DC

## 2022-01-28 MED ORDER — CEFAZOLIN SODIUM-DEXTROSE 2-4 GM/100ML-% IV SOLN
2.0000 g | INTRAVENOUS | Status: AC
  Administered 2022-01-28: 2 g via INTRAVENOUS
  Filled 2022-01-28: qty 100

## 2022-01-28 MED ORDER — VANCOMYCIN HCL IN DEXTROSE 1-5 GM/200ML-% IV SOLN
1000.0000 mg | INTRAVENOUS | Status: AC
  Administered 2022-01-28: 1000 mg via INTRAVENOUS
  Filled 2022-01-28: qty 200

## 2022-01-28 MED ORDER — MIDAZOLAM HCL 2 MG/2ML IJ SOLN
INTRAMUSCULAR | Status: DC | PRN
  Administered 2022-01-28: 2 mg via INTRAVENOUS

## 2022-01-28 MED ORDER — SUGAMMADEX SODIUM 200 MG/2ML IV SOLN
INTRAVENOUS | Status: DC | PRN
  Administered 2022-01-28: 200 mg via INTRAVENOUS

## 2022-01-28 MED ORDER — INSULIN ASPART (W/NIACINAMIDE) 100 UNIT/ML IJ SOLN: 3.0000 [IU] | Freq: Every day | INTRAMUSCULAR | Status: DC | PRN

## 2022-01-28 MED ORDER — INSULIN GLARGINE-YFGN 100 UNIT/ML ~~LOC~~ SOLN
40.0000 [IU] | Freq: Two times a day (BID) | SUBCUTANEOUS | Status: DC
  Administered 2022-01-28: 40 [IU] via SUBCUTANEOUS
  Filled 2022-01-28 (×3): qty 0.4

## 2022-01-28 MED ORDER — ORAL CARE MOUTH RINSE: 15.0000 mL | Freq: Once | OROMUCOSAL | Status: AC

## 2022-01-28 MED ORDER — METHOCARBAMOL 1000 MG/10ML IJ SOLN
500.0000 mg | Freq: Four times a day (QID) | INTRAVENOUS | Status: DC | PRN
  Filled 2022-01-28: qty 5

## 2022-01-28 MED ORDER — CHLORHEXIDINE GLUCONATE CLOTH 2 % EX PADS: 6.0000 | MEDICATED_PAD | Freq: Once | CUTANEOUS | Status: DC

## 2022-01-28 MED ORDER — BUPIVACAINE LIPOSOME 1.3 % IJ SUSP
INTRAMUSCULAR | Status: AC
  Filled 2022-01-28: qty 20

## 2022-01-28 MED ORDER — ROCURONIUM BROMIDE 10 MG/ML (PF) SYRINGE
PREFILLED_SYRINGE | INTRAVENOUS | Status: DC | PRN
  Administered 2022-01-28: 60 mg via INTRAVENOUS
  Administered 2022-01-28 (×2): 20 mg via INTRAVENOUS

## 2022-01-28 MED ORDER — PROMETHAZINE HCL 25 MG/ML IJ SOLN: 6.2500 mg | INTRAMUSCULAR | Status: DC | PRN

## 2022-01-28 MED ORDER — VITAMIN B-12 1000 MCG PO TABS
1000.0000 ug | ORAL_TABLET | Freq: Every day | ORAL | Status: DC
  Administered 2022-01-28: 1000 ug via ORAL
  Filled 2022-01-28: qty 1

## 2022-01-28 MED ORDER — FENTANYL CITRATE (PF) 100 MCG/2ML IJ SOLN
INTRAMUSCULAR | Status: AC
  Filled 2022-01-28: qty 2

## 2022-01-28 MED ORDER — SODIUM CHLORIDE 0.9 % IV SOLN: 250.0000 mL | INTRAVENOUS | Status: DC

## 2022-01-28 MED ORDER — ASPIRIN 81 MG PO CHEW: 81.0000 mg | CHEWABLE_TABLET | Freq: Every day | ORAL | Status: DC

## 2022-01-28 MED ORDER — DEXAMETHASONE SODIUM PHOSPHATE 10 MG/ML IJ SOLN
INTRAMUSCULAR | Status: DC | PRN
  Administered 2022-01-28: 10 mg via INTRAVENOUS

## 2022-01-28 MED ORDER — THROMBIN 5000 UNITS EX SOLR
CUTANEOUS | Status: AC
  Filled 2022-01-28: qty 5000

## 2022-01-28 MED ORDER — PHENOL 1.4 % MT LIQD: 1.0000 | OROMUCOSAL | Status: DC | PRN

## 2022-01-28 MED ORDER — PROPOFOL 10 MG/ML IV BOLUS
INTRAVENOUS | Status: DC | PRN
  Administered 2022-01-28: 150 mg via INTRAVENOUS

## 2022-01-28 MED ORDER — SODIUM CHLORIDE 0.9% FLUSH: 3.0000 mL | Freq: Two times a day (BID) | INTRAVENOUS | Status: DC

## 2022-01-28 MED ORDER — BUPIVACAINE HCL (PF) 0.5 % IJ SOLN
INTRAMUSCULAR | Status: AC
  Filled 2022-01-28: qty 30

## 2022-01-28 MED ORDER — PHENYLEPHRINE HCL-NACL 20-0.9 MG/250ML-% IV SOLN
INTRAVENOUS | Status: DC | PRN
  Administered 2022-01-28: 15 ug/min via INTRAVENOUS

## 2022-01-28 MED ORDER — ACETAMINOPHEN 650 MG RE SUPP
650.0000 mg | RECTAL | Status: DC | PRN
Start: 2022-01-28 — End: 2022-01-29

## 2022-01-28 MED ORDER — SEMAGLUTIDE 14 MG PO TABS: 14.0000 mg | ORAL_TABLET | Freq: Every day | ORAL | Status: DC

## 2022-01-28 MED ORDER — ACETAMINOPHEN 500 MG PO TABS
1000.0000 mg | ORAL_TABLET | Freq: Once | ORAL | Status: AC
  Administered 2022-01-28: 1000 mg via ORAL
  Filled 2022-01-28: qty 2

## 2022-01-28 MED ORDER — OXYCODONE HCL 5 MG PO TABS
10.0000 mg | ORAL_TABLET | ORAL | Status: DC | PRN
  Administered 2022-01-28 – 2022-01-29 (×3): 10 mg via ORAL
  Filled 2022-01-28 (×3): qty 2

## 2022-01-28 MED ORDER — POTASSIUM CHLORIDE IN NACL 20-0.9 MEQ/L-% IV SOLN: INTRAVENOUS | Status: DC

## 2022-01-28 MED ORDER — FENTANYL CITRATE (PF) 250 MCG/5ML IJ SOLN
INTRAMUSCULAR | Status: AC
  Filled 2022-01-28: qty 5

## 2022-01-28 MED ORDER — LIDOCAINE 2% (20 MG/ML) 5 ML SYRINGE
INTRAMUSCULAR | Status: DC | PRN
  Administered 2022-01-28: 80 mg via INTRAVENOUS

## 2022-01-28 MED ORDER — CHLORHEXIDINE GLUCONATE 0.12 % MT SOLN
15.0000 mL | Freq: Once | OROMUCOSAL | Status: AC
  Administered 2022-01-28: 15 mL via OROMUCOSAL
  Filled 2022-01-28: qty 15

## 2022-01-28 MED ORDER — IRBESARTAN 150 MG PO TABS
150.0000 mg | ORAL_TABLET | Freq: Every day | ORAL | Status: DC
  Administered 2022-01-28: 150 mg via ORAL
  Filled 2022-01-28: qty 1

## 2022-01-28 MED ORDER — INSULIN ASPART 100 UNIT/ML IJ SOLN: 0.0000 [IU] | Freq: Every day | INTRAMUSCULAR | Status: DC

## 2022-01-28 MED ORDER — 0.9 % SODIUM CHLORIDE (POUR BTL) OPTIME
TOPICAL | Status: DC | PRN
  Administered 2022-01-28: 1000 mL

## 2022-01-28 MED ORDER — ATORVASTATIN CALCIUM 40 MG PO TABS
40.0000 mg | ORAL_TABLET | Freq: Every day | ORAL | Status: DC
Start: 2022-01-28 — End: 2022-01-29
  Administered 2022-01-28: 40 mg via ORAL
  Filled 2022-01-28: qty 1

## 2022-01-28 MED ORDER — PHENYLEPHRINE 40 MCG/ML (10ML) SYRINGE FOR IV PUSH (FOR BLOOD PRESSURE SUPPORT)
PREFILLED_SYRINGE | INTRAVENOUS | Status: DC | PRN
  Administered 2022-01-28: 120 ug via INTRAVENOUS

## 2022-01-28 MED ORDER — PANTOPRAZOLE SODIUM 40 MG PO TBEC: 40.0000 mg | DELAYED_RELEASE_TABLET | Freq: Every day | ORAL | Status: DC

## 2022-01-28 MED ORDER — METHOCARBAMOL 500 MG PO TABS
500.0000 mg | ORAL_TABLET | Freq: Four times a day (QID) | ORAL | Status: DC | PRN
  Administered 2022-01-28 – 2022-01-29 (×2): 500 mg via ORAL
  Filled 2022-01-28 (×2): qty 1

## 2022-01-28 MED ORDER — ACETAMINOPHEN 325 MG PO TABS
650.0000 mg | ORAL_TABLET | ORAL | Status: DC | PRN
Start: 2022-01-28 — End: 2022-01-29
  Administered 2022-01-28: 650 mg via ORAL
  Filled 2022-01-28: qty 2

## 2022-01-28 MED ORDER — OXYCODONE HCL 5 MG PO TABS
5.0000 mg | ORAL_TABLET | ORAL | Status: DC | PRN
  Administered 2022-01-28: 5 mg via ORAL
  Filled 2022-01-28: qty 1

## 2022-01-28 MED ORDER — POLYETHYLENE GLYCOL 3350 17 G PO PACK: 17.0000 g | PACK | Freq: Every day | ORAL | Status: DC | PRN

## 2022-01-28 MED ORDER — EMPAGLIFLOZIN 25 MG PO TABS: 25.0000 mg | ORAL_TABLET | Freq: Every day | ORAL | Status: DC

## 2022-01-28 MED ORDER — PROPOFOL 10 MG/ML IV BOLUS
INTRAVENOUS | Status: AC
  Filled 2022-01-28: qty 20

## 2022-01-28 MED ORDER — VANCOMYCIN HCL IN DEXTROSE 1-5 GM/200ML-% IV SOLN
1000.0000 mg | Freq: Once | INTRAVENOUS | Status: AC
  Administered 2022-01-28: 1000 mg via INTRAVENOUS
  Filled 2022-01-28: qty 200

## 2022-01-28 MED ORDER — HYDROMORPHONE HCL 1 MG/ML IJ SOLN: 0.5000 mg | INTRAMUSCULAR | Status: DC | PRN

## 2022-01-28 MED ORDER — ONDANSETRON HCL 4 MG PO TABS: 4.0000 mg | ORAL_TABLET | Freq: Four times a day (QID) | ORAL | Status: DC | PRN

## 2022-01-28 MED ORDER — ONDANSETRON HCL 4 MG/2ML IJ SOLN: 4.0000 mg | Freq: Four times a day (QID) | INTRAMUSCULAR | Status: DC | PRN

## 2022-01-28 MED ORDER — FENTANYL CITRATE (PF) 250 MCG/5ML IJ SOLN
INTRAMUSCULAR | Status: DC | PRN
  Administered 2022-01-28 (×2): 25 ug via INTRAVENOUS
  Administered 2022-01-28 (×4): 50 ug via INTRAVENOUS

## 2022-01-28 MED ORDER — FENTANYL CITRATE (PF) 100 MCG/2ML IJ SOLN
25.0000 ug | INTRAMUSCULAR | Status: DC | PRN
  Administered 2022-01-28: 50 ug via INTRAVENOUS

## 2022-01-28 MED ORDER — MIDAZOLAM HCL 2 MG/2ML IJ SOLN
INTRAMUSCULAR | Status: AC
  Filled 2022-01-28: qty 2

## 2022-01-28 MED ORDER — LIDOCAINE-EPINEPHRINE 1 %-1:100000 IJ SOLN
INTRAMUSCULAR | Status: AC
  Filled 2022-01-28: qty 1

## 2022-01-28 SURGICAL SUPPLY — 80 items
BAG COUNTER SPONGE SURGICOUNT (BAG) ×2 IMPLANT
BAND RUBBER #18 3X1/16 STRL (MISCELLANEOUS) IMPLANT
BASKET BONE COLLECTION (BASKET) IMPLANT
BLADE CLIPPER SURG (BLADE) IMPLANT
BLADE SURG 11 STRL SS (BLADE) ×2 IMPLANT
BUR MATCHSTICK NEURO 3.0 LAGG (BURR) ×2 IMPLANT
BUR PRECISION FLUTE 5.0 (BURR) ×2 IMPLANT
CANISTER SUCT 3000ML PPV (MISCELLANEOUS) ×2 IMPLANT
CNTNR URN SCR LID CUP LEK RST (MISCELLANEOUS) ×1 IMPLANT
CONT SPEC 4OZ STRL OR WHT (MISCELLANEOUS) ×1
COVER BACK TABLE 60X90IN (DRAPES) ×2 IMPLANT
DERMABOND ADHESIVE PROPEN (GAUZE/BANDAGES/DRESSINGS) ×2
DERMABOND ADVANCED (GAUZE/BANDAGES/DRESSINGS) ×2
DERMABOND ADVANCED .7 DNX12 (GAUZE/BANDAGES/DRESSINGS) ×2 IMPLANT
DERMABOND ADVANCED .7 DNX6 (GAUZE/BANDAGES/DRESSINGS) IMPLANT
DRAIN JACKSON PRATT 10MM FLAT (MISCELLANEOUS) IMPLANT
DRAPE 3/4 80X56 (DRAPES) ×2 IMPLANT
DRAPE C-ARM 42X72 X-RAY (DRAPES) ×2 IMPLANT
DRAPE C-ARMOR (DRAPES) ×2 IMPLANT
DRAPE LAPAROTOMY 100X72X124 (DRAPES) ×2 IMPLANT
DRAPE MICROSCOPE LEICA (MISCELLANEOUS) ×2 IMPLANT
DRSG OPSITE POSTOP 4X6 (GAUZE/BANDAGES/DRESSINGS) ×2 IMPLANT
DURAPREP 26ML APPLICATOR (WOUND CARE) ×2 IMPLANT
ELECT BLADE INSULATED 6.5IN (ELECTROSURGICAL) ×2
ELECT COATED BLADE 2.86 ST (ELECTRODE) ×2 IMPLANT
ELECT REM PT RETURN 9FT ADLT (ELECTROSURGICAL) ×2
ELECTRODE BLDE INSULATED 6.5IN (ELECTROSURGICAL) ×1 IMPLANT
ELECTRODE REM PT RTRN 9FT ADLT (ELECTROSURGICAL) ×1 IMPLANT
EVACUATOR SILICONE 100CC (DRAIN) IMPLANT
EXTENDER TAB GUIDE SV 5.5/6.0 (INSTRUMENTS) ×8 IMPLANT
GAUZE 4X4 16PLY ~~LOC~~+RFID DBL (SPONGE) IMPLANT
GAUZE SPONGE 4X4 12PLY STRL (GAUZE/BANDAGES/DRESSINGS) IMPLANT
GLOVE EXAM NITRILE XL STR (GLOVE) IMPLANT
GLOVE SURG LTX SZ7.5 (GLOVE) ×2 IMPLANT
GLOVE SURG UNDER POLY LF SZ7.5 (GLOVE) ×2 IMPLANT
GOWN STRL REUS W/ TWL LRG LVL3 (GOWN DISPOSABLE) ×1 IMPLANT
GOWN STRL REUS W/ TWL XL LVL3 (GOWN DISPOSABLE) ×1 IMPLANT
GOWN STRL REUS W/TWL 2XL LVL3 (GOWN DISPOSABLE) IMPLANT
GOWN STRL REUS W/TWL LRG LVL3 (GOWN DISPOSABLE) ×1
GOWN STRL REUS W/TWL XL LVL3 (GOWN DISPOSABLE) ×1
GUIDEWIRE BLUNT NT 450 (WIRE) ×4 IMPLANT
HEMOSTAT POWDER KIT SURGIFOAM (HEMOSTASIS) ×2 IMPLANT
KIT BASIN OR (CUSTOM PROCEDURE TRAY) ×2 IMPLANT
KIT INFUSE XX SMALL 0.7CC (Orthopedic Implant) ×1 IMPLANT
KIT TURNOVER KIT B (KITS) ×2 IMPLANT
KNIFE SURG 188MM BAYONET LONG (INSTRUMENTS) ×1 IMPLANT
MILL MEDIUM DISP (BLADE) ×2 IMPLANT
NDL ASPIRATION RAN815N 8X15 (NEEDLE) IMPLANT
NDL BEVEL TWO-PAK W/1PK (NEEDLE) IMPLANT
NDL HYPO 18GX1.5 BLUNT FILL (NEEDLE) IMPLANT
NDL HYPO 21X1.5 SAFETY (NEEDLE) IMPLANT
NDL SPNL 18GX3.5 QUINCKE PK (NEEDLE) IMPLANT
NEEDLE ASPIRATION RAN815N 8X15 (NEEDLE) ×2 IMPLANT
NEEDLE ASPIRATION RANFAC 8X15 (NEEDLE) ×2
NEEDLE BEVEL TWO-PAK W/1PK (NEEDLE) ×2 IMPLANT
NEEDLE HYPO 18GX1.5 BLUNT FILL (NEEDLE) IMPLANT
NEEDLE HYPO 21X1.5 SAFETY (NEEDLE) IMPLANT
NEEDLE HYPO 22GX1.5 SAFETY (NEEDLE) ×2 IMPLANT
NEEDLE SPNL 18GX3.5 QUINCKE PK (NEEDLE) IMPLANT
NS IRRIG 1000ML POUR BTL (IV SOLUTION) ×2 IMPLANT
PACK LAMINECTOMY NEURO (CUSTOM PROCEDURE TRAY) ×2 IMPLANT
PAD ARMBOARD 7.5X6 YLW CONV (MISCELLANEOUS) ×6 IMPLANT
PUTTY DBF 1CC CORTICAL FIBERS (Putty) ×1 IMPLANT
PUTTY GRAFTON DBF 6CC W/DELIVE (Putty) ×1 IMPLANT
ROD PERC CCM 5.5X55 (Rod) ×2 IMPLANT
SCREW MAS VOYAGER 5.5X50 (Screw) ×2 IMPLANT
SCREW SET 5.5/6.0MM SOLERA (Screw) ×4 IMPLANT
SCREW SPINAL IFIX 6.5X45 (Screw) ×2 IMPLANT
SPACER CATALYFT PL40 11X29 LG (Spacer) ×1 IMPLANT
SPONGE SURGIFOAM ABS GEL 100 (HEMOSTASIS) IMPLANT
SPONGE T-LAP 4X18 ~~LOC~~+RFID (SPONGE) IMPLANT
SUT MNCRL AB 4-0 PS2 18 (SUTURE) ×2 IMPLANT
SUT VIC AB 0 CT1 18XCR BRD8 (SUTURE) ×1 IMPLANT
SUT VIC AB 0 CT1 8-18 (SUTURE) ×1
SUT VIC AB 2-0 CP2 18 (SUTURE) ×2 IMPLANT
SYR 30ML LL (SYRINGE) ×2 IMPLANT
TOWEL GREEN STERILE (TOWEL DISPOSABLE) ×2 IMPLANT
TOWEL GREEN STERILE FF (TOWEL DISPOSABLE) ×2 IMPLANT
TRAY FOLEY MTR SLVR 16FR STAT (SET/KITS/TRAYS/PACK) ×2 IMPLANT
WATER STERILE IRR 1000ML POUR (IV SOLUTION) ×2 IMPLANT

## 2022-01-28 NOTE — Transfer of Care (Signed)
Immediate Anesthesia Transfer of Care Note  Patient: Marc Salinas.  Procedure(s) Performed: RIGHT LUMBAR THREE-FOUR MINIMALLY INVASIVE TRANSFORAMINAL LUMBAR INTERBODY FUSION (Right: Spine Lumbar)  Patient Location: PACU  Anesthesia Type:General  Level of Consciousness: drowsy  Airway & Oxygen Therapy: Patient Spontanous Breathing and Patient connected to nasal cannula oxygen  Post-op Assessment: Report given to RN and Post -op Vital signs reviewed and stable  Post vital signs: Reviewed and stable  Last Vitals:  Vitals Value Taken Time  BP 130/87 01/28/22 1716  Temp    Pulse 63 01/28/22 1719  Resp 13 01/28/22 1719  SpO2 100 % 01/28/22 1719  Vitals shown include unvalidated device data.  Last Pain:  Vitals:   01/28/22 1053  TempSrc:   PainSc: 7       Patients Stated Pain Goal: 0 ([id number])  Complications: No notable events documented.

## 2022-01-28 NOTE — Anesthesia Procedure Notes (Signed)
Procedure Name: Intubation Date/Time: 01/28/2022 1:13 PM Performed by: Clearnce Sorrel, CRNA Pre-anesthesia Checklist: Patient identified, Emergency Drugs available, Suction available and Patient being monitored Patient Re-evaluated:Patient Re-evaluated prior to induction Oxygen Delivery Method: Circle System Utilized Preoxygenation: Pre-oxygenation with 100% oxygen Induction Type: IV induction Ventilation: Mask ventilation without difficulty Laryngoscope Size: Mac and 3 Grade View: Grade I Tube type: Oral Tube size: 7.5 mm Number of attempts: 1 Airway Equipment and Method: Stylet and Oral airway Placement Confirmation: ETT inserted through vocal cords under direct vision, positive ETCO2 and breath sounds checked- equal and bilateral Secured at: 23 cm Tube secured with: Tape Dental Injury: Teeth and Oropharynx as per pre-operative assessment

## 2022-01-28 NOTE — Anesthesia Postprocedure Evaluation (Signed)
Anesthesia Post Note  Patient: Marc Salinas.  Procedure(s) Performed: RIGHT LUMBAR THREE-FOUR MINIMALLY INVASIVE TRANSFORAMINAL LUMBAR INTERBODY FUSION (Right: Spine Lumbar)     Patient location during evaluation: PACU Anesthesia Type: General Level of consciousness: awake and alert and oriented Pain management: pain level controlled Vital Signs Assessment: post-procedure vital signs reviewed and stable Respiratory status: spontaneous breathing, nonlabored ventilation and respiratory function stable Cardiovascular status: blood pressure returned to baseline and stable Postop Assessment: no apparent nausea or vomiting Anesthetic complications: no   No notable events documented.  Last Vitals:  Vitals:   01/28/22 1745 01/28/22 1800  BP: (!) 145/92 137/89  Pulse: (!) 59 (!) 53  Resp: 16 10  Temp:    SpO2: 98% 100%    Last Pain:  Vitals:   01/28/22 1745  TempSrc:   PainSc: 3                  Liban Guedes A.

## 2022-01-28 NOTE — Op Note (Signed)
PREOP DIAGNOSIS: Lumbar radiculopathy with spondylolisthesis  POSTOP DIAGNOSIS: Lumbar radiculopathy with spondylolisthesis  PROCEDURE: 1. Right L3-4 lumbar interbody fusion via transforaminal approach with tubular retractors, including facetectomy, discectomy, and foraminotomy for decompression 2. L3-4 posterior arthrodesis 3. Placement of expandable interbody cage L3-4 4. Nonsegmental instrumentation with percutaneously placed pedicle screw and rod construct at L3-4 5. Harvest of local autograft 6. Use of morselized allograft 7. Use of microscope for microdissection  SURGEON: Dr. Duffy Rhody, MD  ASSISTANT: Weston Brass, NP. Please note, there were no qualified trainees available to assist with the procedure.  An assistant was required for aid in retraction of the neural elements.   ANESTHESIA: General Endotracheal  EBL: 100 ml  IMPLANTS:  Medtronic 5.5 x 50 mm pedicle screws at L3 bilaterally 6.5 x 45 mm pedicle screws at L4 bilaterally 11 mm x 29 mm Expandable Catalyft PL40 cage 55 mm rods x 2  SPECIMENS: None  DRAINS: None  COMPLICATIONS: none  CONDITION: Stable to PACU  HISTORY: Marc Salinas. is a 61 y.o. male with history of L3-4 posterior decompression who presented to clinic with recurrent right lumbar radiculopathy and back pain.  Imaging showed severe stenosis with nerve root impingement at right L3-4, as well as L3-4 spondylolisthesis with dynamic instability.  Nonsurgical treatments including injections and PT failed to improve his symptoms significantly.  Treatment options were discussed and the patient elected to proceed with MIS TLIF at L3-4. risks, benefits, alternatives, and expected convalescence were discussed with the patient.  Risks discussed included but were not limited to bleeding, pain, infection, scar, pseudoarthrosis, CSF leak, neurologic deficit, paralysis, and death.  The patient wished to proceed with surgery and informed consent was  obtained.  PROCEDURE IN DETAIL: After informed consent was obtained and witnessed, the patient was brought to the operating room. After induction of general anesthesia, the patient was positioned on the operative table in the prone position on a Jackson table with all pressure points meticulously padded. The skin of the low back was then prepped and draped in the usual sterile fashion.  Under fluoroscopy, the pedicles of the correct levels were identified and marked out on the skin, and after timeout was conducted, the skin was infiltrated with local anesthetic.  A 10 blade was used to incise the skin and the fascia overlying the pedicles on AP fluoroscopy.  Jamshidi's were then placed at the lateral aspect of the pedicle is identified AP fluoroscopy and malleted into the body under x-ray guidance.  C-arm was then switched to lateral which confirmed entry into the body.  K wires were then passed through the Jamshidi's and the sheaths were removed.  The tubular retractor system was then docked on the right L3-4 facet joint under fluoroscopic guidance.  Sequential dilators and final tubular retractor was placed and locked in position under fluoroscopic guidance.  The microscope was then introduced in the field to allow for intraoperative microdissection.  Remaining muscle was moved off of the degenerated facet joint.  Pars fracture was noted. Remaining inferior facet was dissected from laminotomy scar and removed and harvested for autograft.  Overgrown superior facets was then removed with rongeurs and high speed drill.  There was thick, densely adherent scar which had to be carefully dissected from the traversing nerve root to allow it to be mobilized.  Additionally, there was a fair amount of scar in the foramen that was dissected from the exiting nerve root.  The traversing nerve root was identified and good decompression was performed  with removal of overgrown ligament and facets with rongeurs.  The exiting  nerve root in the foramen was also seen.  Epidural veins in the foramen were coagulated and cut with allowed access to the disc space lateral to the traversing nerve root.  The traversing nerve root was gently retracted medially to allow greater access to the disc.  The disc was opened with 15 blade and pituitary rongeur was used to remove disc material.  Disc shavers of increasing size was then used to clean the disc space as well as to restore disc space height.  The interbody space was prepared with rasps and curettes with removal of the cartilage from the endplates.  Appropriate sized interbody spacer was then sized.  The interbody space was packed with morselized allograft mixed with autograft as well as xxs BMP.  An expandable size 11 mm interbody spacer was then tamped into place with a mallet under x-ray guidance.  He was then expanded until snug with the endplates.  AP and lateral x-ray confirmed good position of the implant.  The wound was then irrigated thoroughly with bacitracin impregnated irrigation and meticulous hemostasis was obtained.  The retractor was then removed and meticulous hemostasis was obtained in the muscle layer and subcutaneous layer.  The pedicles were tapped over the K wires.  The left L3-4 facet was decorticated with a reamer.  Size 5.5 and 6.5 mm pedicle screws were placed at each level under fluoroscopic guidance over the K-wires.  Good purchase was noted.  Rods were then passed through the screw towers bilaterally and secured with screw caps.  X-ray confirmed good placement.  The screw caps were final tightened and the towers removed.  Wounds were irrigated thoroughly.  Small amount of DBF was placed over the decorticated left L3-4 facet.  Exparel mixed with Marcaine was injected into the paraspinous muscles and subcutaneous tissues bilaterally.  The fascia was closed with 0 Vicryl stitches.  The dermal layer was closed with 2-0 Vicryl stitches in buried interrupted fashion.  The  skin incisions were closed with 4-0 Monocryl subcuticular manner followed by Dermabond.  Sterile dressings were placed.  Patient was then flipped supine and extubated by the anesthesia service following commands and all 4 extremities.  All counts were correct at the end of surgery.  No complications were noted.

## 2022-01-28 NOTE — Progress Notes (Signed)
Orthopedic Tech Progress Note Patient Details:  Marc Salinas. 08-Nov-1961 [id number]  Ortho Devices Type of Ortho Device: Lumbar corsett Ortho Device/Splint Location: back Ortho Device/Splint Interventions: Ordered   Post Interventions Patient Tolerated: Well Instructions Provided: Care of device  [name] 01/28/2022, 6:56 PM

## 2022-01-29 ENCOUNTER — Encounter (HOSPITAL_COMMUNITY): Payer: Self-pay | Admitting: Neurosurgery

## 2022-01-29 DIAGNOSIS — M4316 Spondylolisthesis, lumbar region: Secondary | ICD-10-CM | POA: Diagnosis not present

## 2022-01-29 LAB — GLUCOSE, CAPILLARY: Glucose-Capillary: 119 mg/dL — ABNORMAL HIGH (ref 70–99)

## 2022-01-29 MED ORDER — OXYCODONE-ACETAMINOPHEN 5-325 MG PO TABS: 1.0000 | ORAL_TABLET | ORAL | 0 refills | Status: AC | PRN

## 2022-01-29 MED ORDER — METHOCARBAMOL 500 MG PO TABS: 500.0000 mg | ORAL_TABLET | Freq: Four times a day (QID) | ORAL | 2 refills | Status: AC

## 2022-01-29 NOTE — Discharge Summary (Cosign Needed)
Physician Discharge Summary  Patient ID: Marc MollyMervin Groseclose Jr. MRN: [id number] DOB/AGE: October 21, 1961 61 y.o.  Admit date: 01/28/2022 Discharge date: 01/29/2022  Admission Diagnoses: Lumbar radiculopathy with spondylolisthesis  Discharge Diagnoses: Lumbar radiculopathy with spondylolisthesis Principal Problem:   Lumbar radiculopathy   Discharged Condition: good  Hospital Course: The patient was admitted on 01/28/2021 and taken to the operating room where the patient underwent right L3-4 MIS TLIF. The patient tolerated the procedure well and was taken to the recovery room and then to the floor in stable condition. The hospital course was routine. There were no complications. The wound remained clean dry and intact. Pt had appropriate back soreness. No complaints of leg pain or new N/T/W. The patient remained afebrile with stable vital signs, and tolerated a regular diet. The patient continued to increase activities, and pain was well controlled with oral pain medications.   Consults: None  Significant Diagnostic Studies: radiology: X-Ray: intraoperative   Treatments: surgery:  1. Right L3-4 lumbar interbody fusion via transforaminal approach with tubular retractors, including facetectomy, discectomy, and foraminotomy for decompression 2. L3-4 posterior arthrodesis 3. Placement of expandable interbody cage L3-4 4. Nonsegmental instrumentation with percutaneously placed pedicle screw and rod construct at L3-4 5. Harvest of local autograft 6. Use of morselized allograft 7. Use of microscope for microdissection    Discharge Exam: Blood pressure 114/76, pulse 64, temperature 98.5 F (36.9 C), resp. rate 18, height 5\' 11"  (1.803 m), weight 86.2 kg, SpO2 96 %.  Physical Exam: Patient is awake, A/O X 4, conversant, and in good spirits. They are in NAD and VSS. Doing well. Speech is fluent and appropriate. MAEW with good strength that is symmetric bilaterally. 5/5 BUE/BLE. Sensation to light touch  is intact. PERLA, EOMI. CNs grossly intact. Dressing is clean dry intact. Incision is well approximated with no drainage, erythema, or edema.        Disposition: Discharge disposition: 01-Home or Self Care       Discharge Instructions     Incentive spirometry RT   Complete by: As directed       Allergies as of 01/29/2022       Reactions   Bee Venom Swelling        Medication List     TAKE these medications    aspirin 81 MG chewable tablet Chew by mouth daily.   atorvastatin 40 MG tablet Commonly known as: LIPITOR Take 40 mg by mouth daily.   Besivance 0.6 % Susp Generic drug: Besifloxacin HCl Place 1 drop into the right eye See admin instructions. Instill 1 drop into the right eye the day of and the day after eye injection every six weeks   cyanocobalamin 1000 MCG tablet Take 1,000 mcg by mouth daily.   empagliflozin 25 MG Tabs tablet Commonly known as: JARDIANCE Take 25 mg by mouth daily.   Fiasp 100 UNIT/ML Soln Generic drug: Insulin Aspart (w/Niacinamide) Inject 3-8 Units into the skin daily as needed (blood sugar above 280).   isosorbide mononitrate 30 MG 24 hr tablet Commonly known as: IMDUR Take 30 mg by mouth daily.   methocarbamol 500 MG tablet Commonly known as: Robaxin Take 1 tablet (500 mg total) by mouth 4 (four) times daily.   NON FORMULARY Pt uses cpap nightly   oxyCODONE-acetaminophen 5-325 MG tablet Commonly known as: Percocet Take 1-2 tablets by mouth every 4 (four) hours as needed for severe pain.   pantoprazole 40 MG tablet Commonly known as: PROTONIX Take 40 mg by mouth daily.  Rybelsus 14 MG Tabs Generic drug: Semaglutide Take 14 mg by mouth daily before breakfast.   telmisartan 40 MG tablet Commonly known as: MICARDIS Take 40 mg by mouth daily.   Toujeo SoloStar 300 UNIT/ML Solostar Pen Generic drug: insulin glargine (1 Unit Dial) Inject 40 Units into the skin in the morning and at bedtime.          Signed: Marvis Moeller, DNP, NP-C 01/29/2022, 8:28 AM

## 2022-01-29 NOTE — Discharge Instructions (Addendum)
Discharge wound care:  Remove outer dressing in 3 days Leave the wound open to air. Shower daily with the wound uncovered. Water and soapy water should run over the incision area.  Do not wash directly on the incision for 2 weeks. Remove the glue after 2 weeks. Driving Restrictions No driving for 2 weeks. May ride in the car locally now. May begin to drive locally in 2 weeks. Other Restrictions Walk gradually increasing distances out in the fresh air at least twice a day. Walking additional 6 times inside the house, gradually increasing distances, daily. No bending, lifting, or twisting. Perform activities between shoulder and waist height (that is at counter height when standing or table height when sitting). Any drainage, or swelling call the doctor's office. [phone]

## 2022-01-29 NOTE — Progress Notes (Signed)
Patient alert and oriented, mae's well, voiding adequate amount of urine, swallowing without difficulty, no c/o pain at time of discharge. Patient discharged home with family. Script and discharged instructions given to patient. Patient and family stated understanding of instructions given. Patient has an appointment with Dr. Thomas ?

## 2022-01-29 NOTE — Evaluation (Signed)
Physical Therapy Evaluation and Discharge Patient Details Name: Marc Salinas. MRN: [id number] DOB: 01-Feb-1961 Today's Date: 01/29/2022  History of Present Illness  Pt is a 61 y/o M s/p L3-4 MIS TLIF.  PMH includes: Arthritis, Diabetes mellitus without complication, Renal cyst, Sleep apnea.   Clinical Impression  Patient evaluated by Physical Therapy with no further acute PT needs identified. All education has been completed and the patient has no further questions. Pt was able to demonstrate transfers and ambulation with gross modified independence to supervision for safety and no AD. Pt was educated on precautions, brace application/wearing schedule, appropriate activity progression, and car transfer. See below for any follow-up Physical Therapy or equipment needs. PT is signing off. Thank you for this referral.        Recommendations for follow up therapy are one component of a multi-disciplinary discharge planning process, led by the attending physician.  Recommendations may be updated based on patient status, additional functional criteria and insurance authorization.  Follow Up Recommendations No PT follow up    Assistance Recommended at Discharge PRN  Patient can return home with the following  A little help with walking and/or transfers;Assist for transportation;Help with stairs or ramp for entrance    Equipment Recommendations None recommended by PT  Recommendations for Other Services       Functional Status Assessment Patient has had a recent decline in their functional status and demonstrates the ability to make significant improvements in function in a reasonable and predictable amount of time.     Precautions / Restrictions Precautions Precautions: Back Precaution Booklet Issued: No Precaution Comments: Reviewed Required Braces or Orthoses: Spinal Brace Spinal Brace: Lumbar corset Restrictions Weight Bearing Restrictions: No      Mobility  Bed Mobility                General bed mobility comments: Verbally reviewed log roll technique. Pt was received exiting the bathroom.    Transfers Overall transfer level: Modified independent Equipment used: None Transfers: Sit to/from Stand             General transfer comment: VC's for wide BOS and hand placement on seated surface for safety.    Ambulation/Gait Ambulation/Gait assistance: Supervision Gait Distance (Feet): 400 Feet Assistive device: None Gait Pattern/deviations: Step-through pattern, Decreased stride length, Trunk flexed Gait velocity: Decreased Gait velocity interpretation: 1.31 - 2.62 ft/sec, indicative of limited community ambulator   General Gait Details: VC's for improved posture throughout. Pt able to make corrective changes but unable to maintain. Overall no assist required and no LOB noted.  Stairs Stairs: Yes Stairs assistance: Supervision Stair Management: One rail Left, Step to pattern, Sideways Number of Stairs: 10 General stair comments: VC's for sequencing and general safety. Pt was able to negotiate the flight of stairs well without unsteadiness or LOB.  Wheelchair Mobility    Modified Rankin (Stroke Patients Only)       Balance Overall balance assessment: Mild deficits observed, not formally tested                                           Pertinent Vitals/Pain Pain Assessment Pain Assessment: Faces Faces Pain Scale: Hurts even more Pain Location: low back Pain Descriptors / Indicators: Discomfort, Operative site guarding, Grimacing, Guarding Pain Intervention(s): Limited activity within patient's tolerance, Monitored during session, Repositioned    Home Living Family/patient expects to be discharged  to:: Private residence Living Arrangements: Spouse/significant other Available Help at Discharge: Family;Available 24 hours/day Type of Home: House Home Access: Stairs to enter   Entrance Stairs-Number of Steps:  1 Alternate Level Stairs-Number of Steps: full flight Home Layout: Two level;Able to live on main level with bedroom/bathroom Home Equipment: Shower seat      Prior Function Prior Level of Function : Independent/Modified Independent                     Hand Dominance   Dominant Hand: Right    Extremity/Trunk Assessment   Upper Extremity Assessment Upper Extremity Assessment: Overall WFL for tasks assessed    Lower Extremity Assessment Lower Extremity Assessment: Generalized weakness (Mild; consistent with pre-op diagnosis)    Cervical / Trunk Assessment Cervical / Trunk Assessment: Back Surgery  Communication   Communication: No difficulties  Cognition Arousal/Alertness: Awake/alert Behavior During Therapy: WFL for tasks assessed/performed Overall Cognitive Status: Within Functional Limits for tasks assessed                                          General Comments      Exercises     Assessment/Plan    PT Assessment Patient does not need any further PT services  PT Problem List         PT Treatment Interventions      PT Goals (Current goals can be found in the Care Plan section)  Acute Rehab PT Goals Patient Stated Goal: Home today, back to work PT Goal Formulation: All assessment and education complete, DC therapy    Frequency       Co-evaluation               AM-PAC PT "6 Clicks" Mobility  Outcome Measure Help needed turning from your back to your side while in a flat bed without using bedrails?: None Help needed moving from lying on your back to sitting on the side of a flat bed without using bedrails?: None Help needed moving to and from a bed to a chair (including a wheelchair)?: A Little Help needed standing up from a chair using your arms (e.g., wheelchair or bedside chair)?: A Little Help needed to walk in hospital room?: A Little Help needed climbing 3-5 steps with a railing? : A Little 6 Click Score: 20     End of Session Equipment Utilized During Treatment: Gait belt;Back brace Activity Tolerance: Patient tolerated treatment well Patient left: in chair;with call bell/phone within reach Nurse Communication: Mobility status PT Visit Diagnosis: Unsteadiness on feet (R26.81);Pain Pain - part of body:  (back)    Time: 7544-9201 PT Time Calculation (min) (ACUTE ONLY): 20 min   Charges:   PT Evaluation $PT Eval Low Complexity: 1 Low          Conni Slipper, PT, DPT Acute Rehabilitation Services Pager: [phone] Office: [phone]   Marylynn Pearson 01/29/2022, 3:36 PM

## 2022-01-29 NOTE — Evaluation (Signed)
Occupational Therapy Evaluation Patient Details Name: Marc Salinas. MRN: [id number] DOB: 1961/09/30 Today's Date: 01/29/2022   History of Present Illness 61 yo M s/p L3-4 MIS TLIF.  PMH includes: Arthritis, Diabetes mellitus without complication, Renal cyst, Sleep apnea.   Clinical Impression   Patient admitted for the above procedure.  PTA he lives with his spouse, who is able to assist as needed.  Post op discomfort is the primary barrier.  Currently he is needing setup and minimal supervision for in room mobility and ADL completion at a sit/stand level.  Precautions reviewed, and all questions answered.  No further OT needs in the acute setting.        Recommendations for follow up therapy are one component of a multi-disciplinary discharge planning process, led by the attending physician.  Recommendations may be updated based on patient status, additional functional criteria and insurance authorization.   Follow Up Recommendations  No OT follow up    Assistance Recommended at Discharge Intermittent Supervision/Assistance  Patient can return home with the following      Functional Status Assessment  Patient has had a recent decline in their functional status and demonstrates the ability to make significant improvements in function in a reasonable and predictable amount of time.  Equipment Recommendations  None recommended by OT    Recommendations for Other Services       Precautions / Restrictions Precautions Precautions: Back Precaution Booklet Issued: No Precaution Comments: Reviewed Required Braces or Orthoses: Spinal Brace Spinal Brace: Lumbar corset Restrictions Weight Bearing Restrictions: No      Mobility Bed Mobility Overal bed mobility: Needs Assistance Bed Mobility: Sidelying to Sit   Sidelying to sit: Supervision            Transfers Overall transfer level: Needs assistance   Transfers: Sit to/from Stand, Bed to chair/wheelchair/BSC Sit to  Stand: Supervision     Step pivot transfers: Supervision            Balance Overall balance assessment: Mild deficits observed, not formally tested                                         ADL either performed or assessed with clinical judgement   ADL Overall ADL's : At baseline                                       General ADL Comments: slow and purposeful with post op discomfort, but able to complete ADL from sit/stand level.     Vision Baseline Vision/History: 1 Wears glasses Patient Visual Report: No change from baseline       Perception Perception Perception: Not tested   Praxis Praxis Praxis: Not tested    Pertinent Vitals/Pain Pain Assessment Pain Assessment: Faces Faces Pain Scale: Hurts even more Pain Location: low back Pain Descriptors / Indicators: Discomfort, Operative site guarding, Grimacing, Guarding Pain Intervention(s): Monitored during session     Hand Dominance Right   Extremity/Trunk Assessment Upper Extremity Assessment Upper Extremity Assessment: Overall WFL for tasks assessed   Lower Extremity Assessment Lower Extremity Assessment: Defer to PT evaluation   Cervical / Trunk Assessment Cervical / Trunk Assessment: Back Surgery   Communication Communication Communication: No difficulties   Cognition Arousal/Alertness: Awake/alert Behavior During Therapy: WFL for tasks assessed/performed Overall Cognitive Status: Within  Functional Limits for tasks assessed                                       General Comments   VSS on RA    Exercises     Shoulder Instructions      Home Living Family/patient expects to be discharged to:: Private residence Living Arrangements: Spouse/significant other Available Help at Discharge: Family;Available 24 hours/day Type of Home: House Home Access: Stairs to enter [employer] of Steps: 1   Home Layout: Two level;Able to live on main  level with bedroom/bathroom Alternate Level Stairs-Number of Steps: full flight   Bathroom Shower/Tub: Producer, television/film/video: Standard Bathroom Accessibility: Yes How Accessible: Accessible via walker Home Equipment: Shower seat          Prior Functioning/Environment Prior Level of Function : Independent/Modified Independent                        OT Problem List: Pain      OT Treatment/Interventions:      OT Goals(Current goals can be found in the care plan section) Acute Rehab OT Goals Patient Stated Goal: Return home OT Goal Formulation: With patient Time For Goal Achievement: 02/02/22 Potential to Achieve Goals: Good  OT Frequency:      Co-evaluation              AM-PAC OT "6 Clicks" Daily Activity     Outcome Measure Help from another person eating meals?: None Help from another person taking care of personal grooming?: None Help from another person toileting, which includes using toliet, bedpan, or urinal?: A Little Help from another person bathing (including washing, rinsing, drying)?: A Little Help from another person to put on and taking off regular upper body clothing?: None Help from another person to put on and taking off regular lower body clothing?: A Little 6 Click Score: 21   End of Session    Activity Tolerance: Patient tolerated treatment well Patient left: in chair;with call bell/phone within reach  OT Visit Diagnosis: Unsteadiness on feet (R26.81);Pain                Time: 0825-0850 OT Time Calculation (min): 25 min Charges:  OT General Charges $OT Visit: 1 Visit OT Evaluation $OT Eval Moderate Complexity: 1 Mod OT Treatments $Self Care/Home Management : 8-22 mins  01/29/2022  RP, OTR/L  Acute Rehabilitation Services  Office:  [phone]   Suzanna Obey 01/29/2022, 9:00 AM

## 2022-02-06 ENCOUNTER — Encounter (INDEPENDENT_AMBULATORY_CARE_PROVIDER_SITE_OTHER): Payer: BC Managed Care – PPO | Admitting: Ophthalmology

## 2022-02-06 ENCOUNTER — Other Ambulatory Visit: Payer: Self-pay

## 2022-02-06 DIAGNOSIS — H2513 Age-related nuclear cataract, bilateral: Secondary | ICD-10-CM

## 2022-02-06 DIAGNOSIS — E113311 Type 2 diabetes mellitus with moderate nonproliferative diabetic retinopathy with macular edema, right eye: Secondary | ICD-10-CM | POA: Diagnosis not present

## 2022-02-06 DIAGNOSIS — H43813 Vitreous degeneration, bilateral: Secondary | ICD-10-CM | POA: Diagnosis not present

## 2022-02-06 DIAGNOSIS — E113592 Type 2 diabetes mellitus with proliferative diabetic retinopathy without macular edema, left eye: Secondary | ICD-10-CM | POA: Diagnosis not present

## 2022-03-13 ENCOUNTER — Encounter (INDEPENDENT_AMBULATORY_CARE_PROVIDER_SITE_OTHER): Payer: BC Managed Care – PPO | Admitting: Ophthalmology

## 2022-03-24 ENCOUNTER — Encounter (INDEPENDENT_AMBULATORY_CARE_PROVIDER_SITE_OTHER): Payer: BC Managed Care – PPO | Admitting: Ophthalmology

## 2022-03-26 ENCOUNTER — Encounter (INDEPENDENT_AMBULATORY_CARE_PROVIDER_SITE_OTHER): Payer: BC Managed Care – PPO | Admitting: Ophthalmology

## 2022-03-26 DIAGNOSIS — E113512 Type 2 diabetes mellitus with proliferative diabetic retinopathy with macular edema, left eye: Secondary | ICD-10-CM | POA: Diagnosis not present

## 2022-03-26 DIAGNOSIS — H2513 Age-related nuclear cataract, bilateral: Secondary | ICD-10-CM | POA: Diagnosis not present

## 2022-03-26 DIAGNOSIS — H43813 Vitreous degeneration, bilateral: Secondary | ICD-10-CM

## 2022-03-26 DIAGNOSIS — E113311 Type 2 diabetes mellitus with moderate nonproliferative diabetic retinopathy with macular edema, right eye: Secondary | ICD-10-CM | POA: Diagnosis not present

## 2022-04-24 ENCOUNTER — Encounter (INDEPENDENT_AMBULATORY_CARE_PROVIDER_SITE_OTHER): Payer: BC Managed Care – PPO | Admitting: Ophthalmology

## 2022-04-24 DIAGNOSIS — E113511 Type 2 diabetes mellitus with proliferative diabetic retinopathy with macular edema, right eye: Secondary | ICD-10-CM | POA: Diagnosis not present

## 2022-04-24 DIAGNOSIS — H2513 Age-related nuclear cataract, bilateral: Secondary | ICD-10-CM

## 2022-04-24 DIAGNOSIS — E113312 Type 2 diabetes mellitus with moderate nonproliferative diabetic retinopathy with macular edema, left eye: Secondary | ICD-10-CM | POA: Diagnosis not present

## 2022-04-24 DIAGNOSIS — H43813 Vitreous degeneration, bilateral: Secondary | ICD-10-CM

## 2022-05-21 ENCOUNTER — Encounter (INDEPENDENT_AMBULATORY_CARE_PROVIDER_SITE_OTHER): Payer: BC Managed Care – PPO | Admitting: Ophthalmology

## 2022-05-21 DIAGNOSIS — E113512 Type 2 diabetes mellitus with proliferative diabetic retinopathy with macular edema, left eye: Secondary | ICD-10-CM | POA: Diagnosis not present

## 2022-05-21 DIAGNOSIS — H43813 Vitreous degeneration, bilateral: Secondary | ICD-10-CM

## 2022-05-21 DIAGNOSIS — E113311 Type 2 diabetes mellitus with moderate nonproliferative diabetic retinopathy with macular edema, right eye: Secondary | ICD-10-CM

## 2022-06-19 ENCOUNTER — Encounter (INDEPENDENT_AMBULATORY_CARE_PROVIDER_SITE_OTHER): Payer: BC Managed Care – PPO | Admitting: Ophthalmology

## 2022-06-26 ENCOUNTER — Encounter (INDEPENDENT_AMBULATORY_CARE_PROVIDER_SITE_OTHER): Payer: BC Managed Care – PPO | Admitting: Ophthalmology

## 2022-06-26 DIAGNOSIS — H43813 Vitreous degeneration, bilateral: Secondary | ICD-10-CM

## 2022-06-26 DIAGNOSIS — E113512 Type 2 diabetes mellitus with proliferative diabetic retinopathy with macular edema, left eye: Secondary | ICD-10-CM | POA: Diagnosis not present

## 2022-06-26 DIAGNOSIS — E113311 Type 2 diabetes mellitus with moderate nonproliferative diabetic retinopathy with macular edema, right eye: Secondary | ICD-10-CM

## 2022-07-08 ENCOUNTER — Other Ambulatory Visit: Payer: Self-pay | Admitting: Family Medicine

## 2022-07-08 DIAGNOSIS — M5416 Radiculopathy, lumbar region: Secondary | ICD-10-CM

## 2022-07-22 ENCOUNTER — Ambulatory Visit
Admission: RE | Admit: 2022-07-22 | Discharge: 2022-07-22 | Disposition: A | Discharge status: Home / Self Care | Payer: BC Managed Care – PPO | Source: Ambulatory Visit | Attending: Family Medicine | Admitting: Family Medicine

## 2022-07-22 DIAGNOSIS — M5416 Radiculopathy, lumbar region: Secondary | ICD-10-CM

## 2022-07-24 ENCOUNTER — Encounter (INDEPENDENT_AMBULATORY_CARE_PROVIDER_SITE_OTHER): Payer: BC Managed Care – PPO | Admitting: Ophthalmology

## 2022-07-24 DIAGNOSIS — E113311 Type 2 diabetes mellitus with moderate nonproliferative diabetic retinopathy with macular edema, right eye: Secondary | ICD-10-CM

## 2022-07-24 DIAGNOSIS — H43813 Vitreous degeneration, bilateral: Secondary | ICD-10-CM

## 2022-07-24 DIAGNOSIS — E113512 Type 2 diabetes mellitus with proliferative diabetic retinopathy with macular edema, left eye: Secondary | ICD-10-CM | POA: Diagnosis not present

## 2022-08-21 ENCOUNTER — Encounter (INDEPENDENT_AMBULATORY_CARE_PROVIDER_SITE_OTHER): Payer: BC Managed Care – PPO | Admitting: Ophthalmology

## 2022-08-24 ENCOUNTER — Encounter (INDEPENDENT_AMBULATORY_CARE_PROVIDER_SITE_OTHER): Payer: BC Managed Care – PPO | Admitting: Ophthalmology

## 2022-08-24 DIAGNOSIS — H43813 Vitreous degeneration, bilateral: Secondary | ICD-10-CM

## 2022-08-24 DIAGNOSIS — E113311 Type 2 diabetes mellitus with moderate nonproliferative diabetic retinopathy with macular edema, right eye: Secondary | ICD-10-CM | POA: Diagnosis not present

## 2022-08-24 DIAGNOSIS — E113512 Type 2 diabetes mellitus with proliferative diabetic retinopathy with macular edema, left eye: Secondary | ICD-10-CM

## 2022-09-25 ENCOUNTER — Encounter (INDEPENDENT_AMBULATORY_CARE_PROVIDER_SITE_OTHER): Payer: BC Managed Care – PPO | Admitting: Ophthalmology

## 2022-09-25 DIAGNOSIS — H43813 Vitreous degeneration, bilateral: Secondary | ICD-10-CM | POA: Diagnosis not present

## 2022-09-25 DIAGNOSIS — E113311 Type 2 diabetes mellitus with moderate nonproliferative diabetic retinopathy with macular edema, right eye: Secondary | ICD-10-CM | POA: Diagnosis not present

## 2022-09-25 DIAGNOSIS — E113512 Type 2 diabetes mellitus with proliferative diabetic retinopathy with macular edema, left eye: Secondary | ICD-10-CM | POA: Diagnosis not present

## 2022-10-23 ENCOUNTER — Encounter (INDEPENDENT_AMBULATORY_CARE_PROVIDER_SITE_OTHER): Payer: BC Managed Care – PPO | Admitting: Ophthalmology

## 2022-10-26 ENCOUNTER — Encounter (INDEPENDENT_AMBULATORY_CARE_PROVIDER_SITE_OTHER): Payer: BC Managed Care – PPO | Admitting: Ophthalmology

## 2022-10-26 DIAGNOSIS — E113512 Type 2 diabetes mellitus with proliferative diabetic retinopathy with macular edema, left eye: Secondary | ICD-10-CM | POA: Diagnosis not present

## 2022-10-26 DIAGNOSIS — E113311 Type 2 diabetes mellitus with moderate nonproliferative diabetic retinopathy with macular edema, right eye: Secondary | ICD-10-CM | POA: Diagnosis not present

## 2022-10-26 DIAGNOSIS — H43813 Vitreous degeneration, bilateral: Secondary | ICD-10-CM | POA: Diagnosis not present

## 2022-11-23 ENCOUNTER — Encounter (INDEPENDENT_AMBULATORY_CARE_PROVIDER_SITE_OTHER): Payer: BC Managed Care – PPO | Admitting: Ophthalmology

## 2022-11-23 DIAGNOSIS — E113311 Type 2 diabetes mellitus with moderate nonproliferative diabetic retinopathy with macular edema, right eye: Secondary | ICD-10-CM | POA: Diagnosis not present

## 2022-11-23 DIAGNOSIS — E113592 Type 2 diabetes mellitus with proliferative diabetic retinopathy without macular edema, left eye: Secondary | ICD-10-CM

## 2022-11-23 DIAGNOSIS — H43813 Vitreous degeneration, bilateral: Secondary | ICD-10-CM | POA: Diagnosis not present

## 2022-12-21 ENCOUNTER — Encounter (INDEPENDENT_AMBULATORY_CARE_PROVIDER_SITE_OTHER): Payer: BC Managed Care – PPO | Admitting: Ophthalmology

## 2022-12-21 DIAGNOSIS — E113512 Type 2 diabetes mellitus with proliferative diabetic retinopathy with macular edema, left eye: Secondary | ICD-10-CM

## 2022-12-21 DIAGNOSIS — H43813 Vitreous degeneration, bilateral: Secondary | ICD-10-CM

## 2022-12-21 DIAGNOSIS — E113311 Type 2 diabetes mellitus with moderate nonproliferative diabetic retinopathy with macular edema, right eye: Secondary | ICD-10-CM | POA: Diagnosis not present

## 2023-01-19 ENCOUNTER — Encounter (INDEPENDENT_AMBULATORY_CARE_PROVIDER_SITE_OTHER): Payer: BC Managed Care – PPO | Admitting: Ophthalmology

## 2023-01-19 DIAGNOSIS — H43813 Vitreous degeneration, bilateral: Secondary | ICD-10-CM

## 2023-01-19 DIAGNOSIS — E113311 Type 2 diabetes mellitus with moderate nonproliferative diabetic retinopathy with macular edema, right eye: Secondary | ICD-10-CM | POA: Diagnosis not present

## 2023-01-19 DIAGNOSIS — E113512 Type 2 diabetes mellitus with proliferative diabetic retinopathy with macular edema, left eye: Secondary | ICD-10-CM | POA: Diagnosis not present

## 2023-02-08 IMAGING — RF DG LUMBAR SPINE 2-3V
1 series · 5 of 5 positions shown · non-contrast
Comparison: 12/10/2021

CLINICAL DATA: Fluoroscopic assistance for lumbar spine surgery.

EXAM:
LUMBAR SPINE - 2-3 VIEW

[Series 1: run · 5 of 5 slices shown]
[im 1/5]
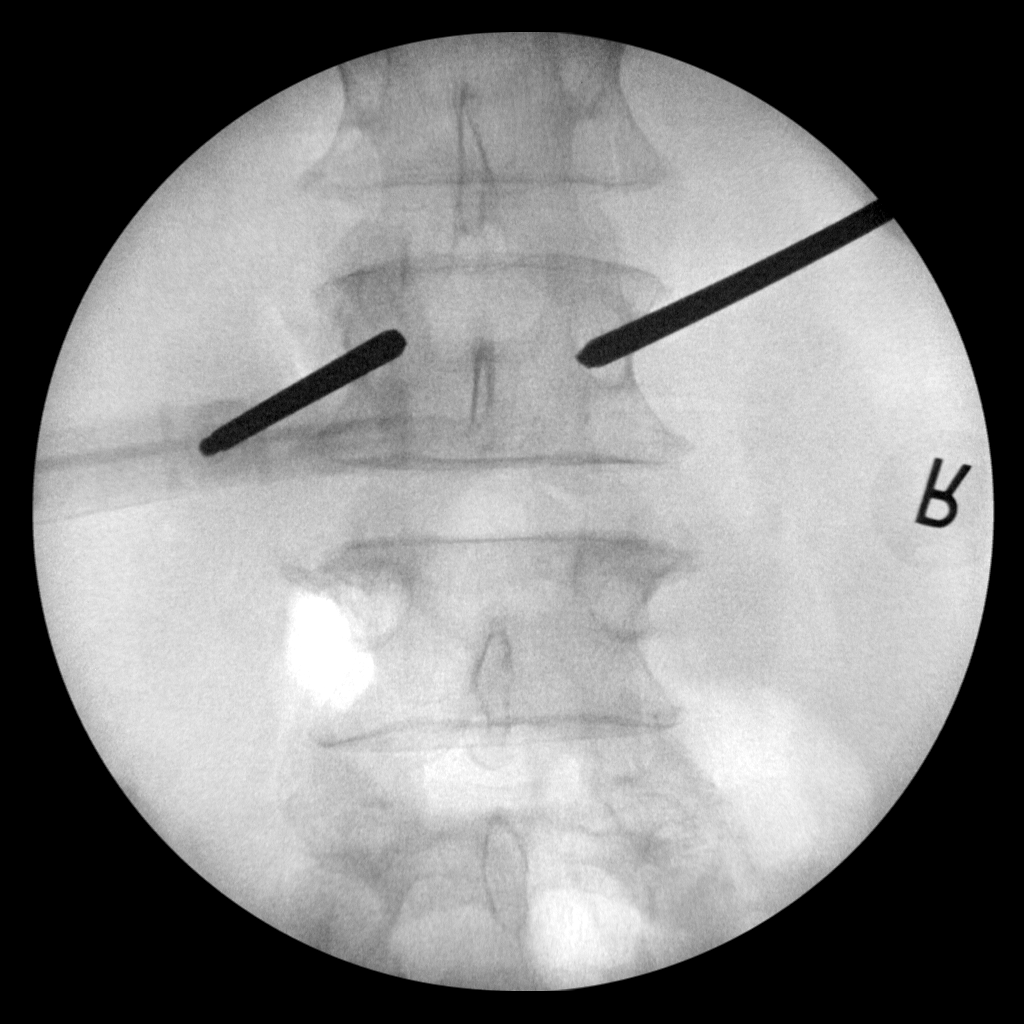
[im 2/5]
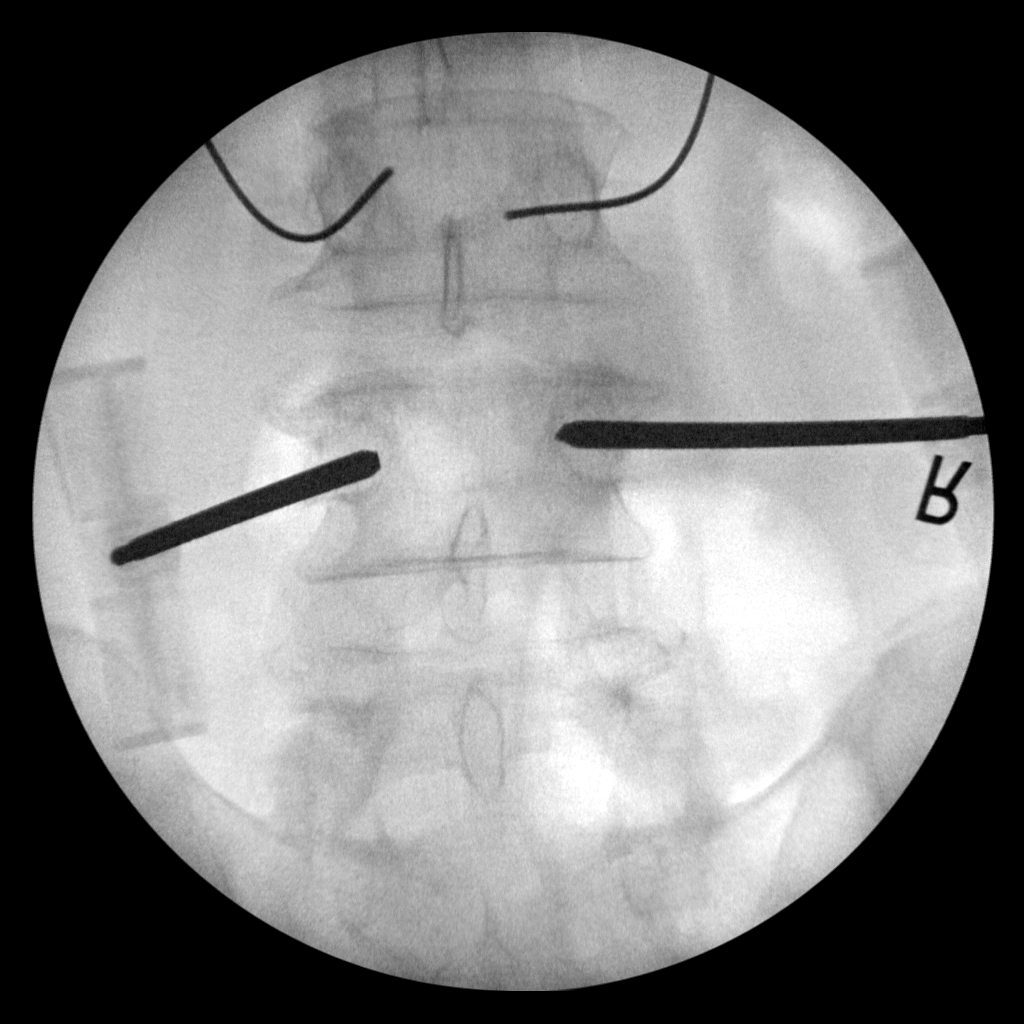
[im 3/5]
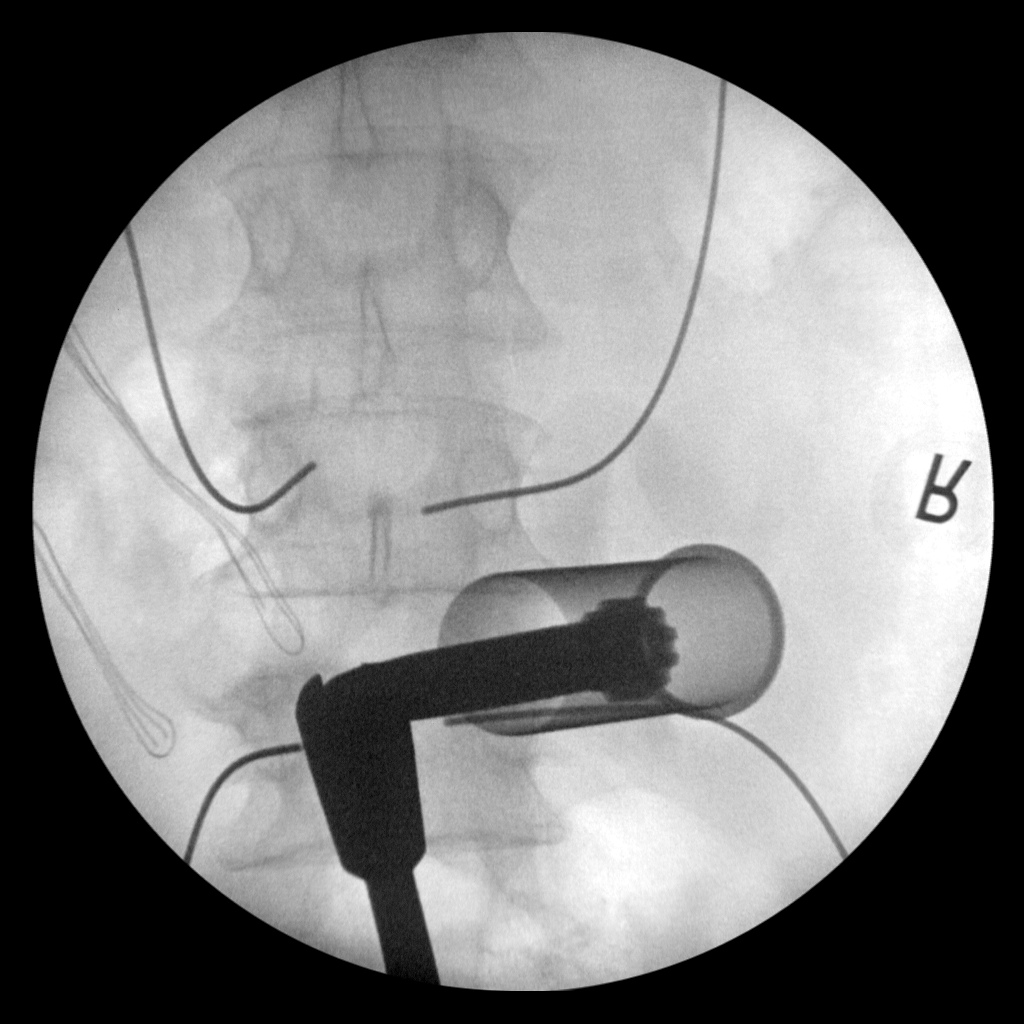
[im 4/5]
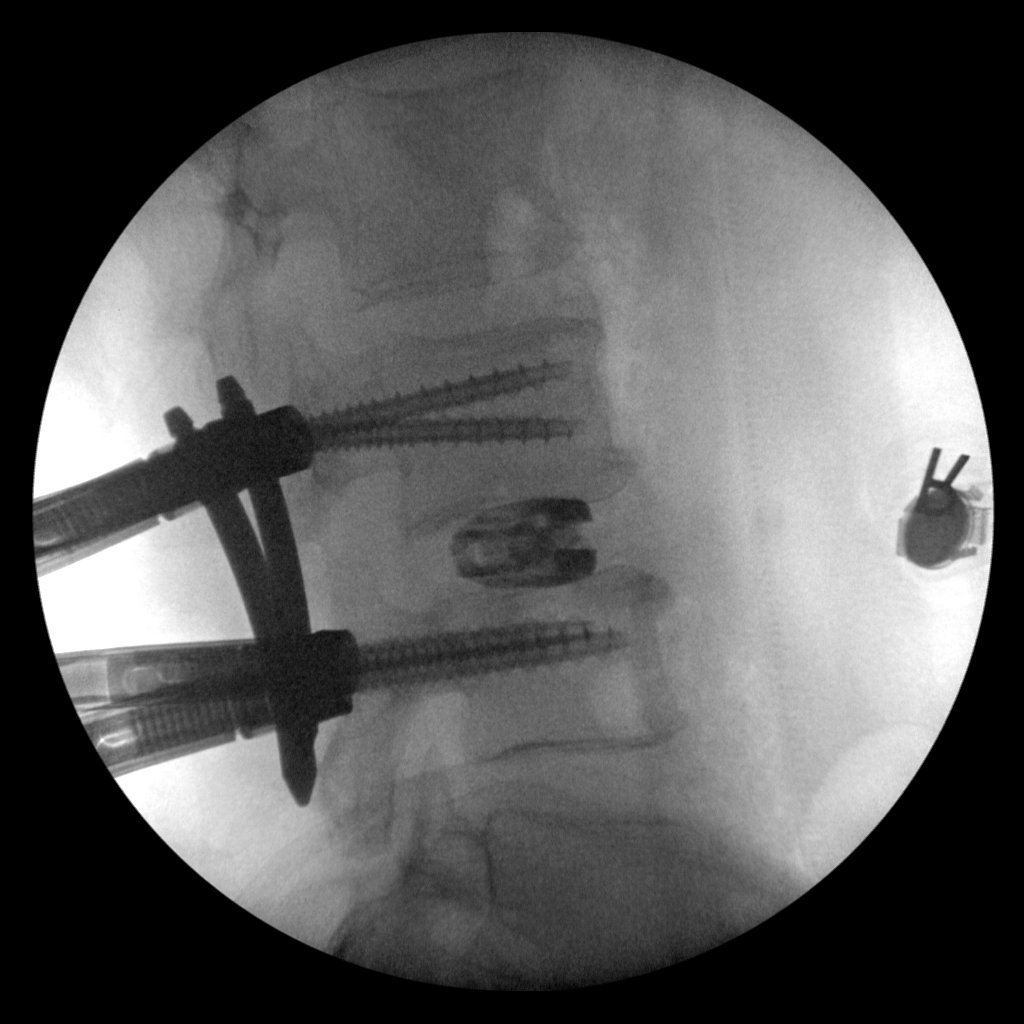
[im 5/5]
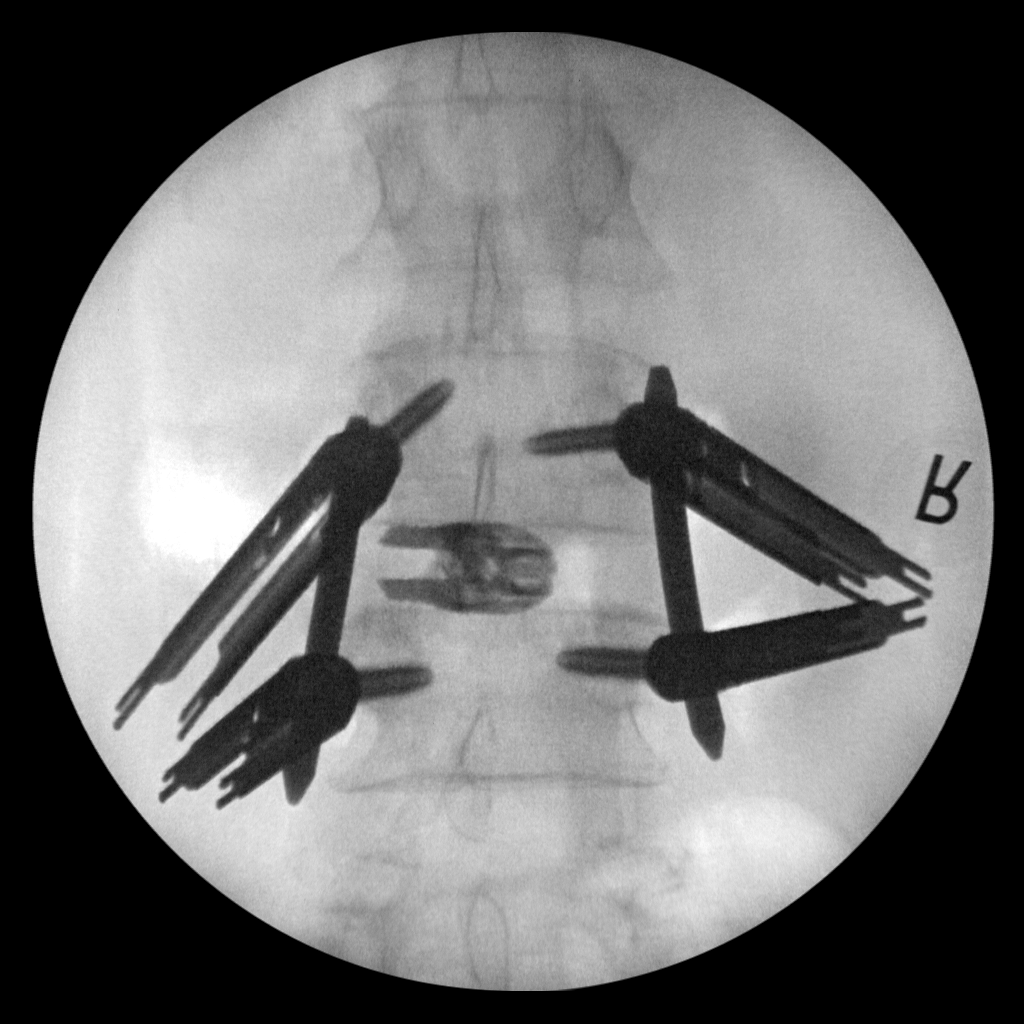

[5 of 5 positions shown; findings below may reference images not displayed]

FINDINGS: Fluoroscopic images show surgical fusion at L3-L4 level.
Intervertebral disc spacer is noted. Fluoroscopic time was 110
seconds. Radiation dose is 61.28 mGy.
IMPRESSION: Fluoroscopic assistance was provided for surgical fusion at L3-L4
level.

## 2023-02-16 ENCOUNTER — Encounter (INDEPENDENT_AMBULATORY_CARE_PROVIDER_SITE_OTHER): Payer: BC Managed Care – PPO | Admitting: Ophthalmology

## 2023-02-16 DIAGNOSIS — H43813 Vitreous degeneration, bilateral: Secondary | ICD-10-CM

## 2023-02-16 DIAGNOSIS — E113512 Type 2 diabetes mellitus with proliferative diabetic retinopathy with macular edema, left eye: Secondary | ICD-10-CM

## 2023-02-16 DIAGNOSIS — E113311 Type 2 diabetes mellitus with moderate nonproliferative diabetic retinopathy with macular edema, right eye: Secondary | ICD-10-CM | POA: Diagnosis not present

## 2023-03-24 ENCOUNTER — Encounter (INDEPENDENT_AMBULATORY_CARE_PROVIDER_SITE_OTHER): Payer: BC Managed Care – PPO | Admitting: Ophthalmology

## 2023-03-24 DIAGNOSIS — H2513 Age-related nuclear cataract, bilateral: Secondary | ICD-10-CM

## 2023-03-24 DIAGNOSIS — E113311 Type 2 diabetes mellitus with moderate nonproliferative diabetic retinopathy with macular edema, right eye: Secondary | ICD-10-CM

## 2023-03-24 DIAGNOSIS — H43813 Vitreous degeneration, bilateral: Secondary | ICD-10-CM | POA: Diagnosis not present

## 2023-03-24 DIAGNOSIS — E113512 Type 2 diabetes mellitus with proliferative diabetic retinopathy with macular edema, left eye: Secondary | ICD-10-CM

## 2023-04-23 ENCOUNTER — Encounter (INDEPENDENT_AMBULATORY_CARE_PROVIDER_SITE_OTHER): Payer: BC Managed Care – PPO | Admitting: Ophthalmology

## 2023-05-20 ENCOUNTER — Encounter (INDEPENDENT_AMBULATORY_CARE_PROVIDER_SITE_OTHER): Payer: BC Managed Care – PPO | Admitting: Ophthalmology

## 2023-05-20 DIAGNOSIS — Z7984 Long term (current) use of oral hypoglycemic drugs: Secondary | ICD-10-CM

## 2023-05-20 DIAGNOSIS — E113311 Type 2 diabetes mellitus with moderate nonproliferative diabetic retinopathy with macular edema, right eye: Secondary | ICD-10-CM | POA: Diagnosis not present

## 2023-05-20 DIAGNOSIS — E113512 Type 2 diabetes mellitus with proliferative diabetic retinopathy with macular edema, left eye: Secondary | ICD-10-CM

## 2023-05-20 DIAGNOSIS — H43813 Vitreous degeneration, bilateral: Secondary | ICD-10-CM | POA: Diagnosis not present

## 2023-06-17 ENCOUNTER — Encounter (INDEPENDENT_AMBULATORY_CARE_PROVIDER_SITE_OTHER): Payer: BC Managed Care – PPO | Admitting: Ophthalmology

## 2023-06-17 DIAGNOSIS — E113311 Type 2 diabetes mellitus with moderate nonproliferative diabetic retinopathy with macular edema, right eye: Secondary | ICD-10-CM

## 2023-06-17 DIAGNOSIS — E113512 Type 2 diabetes mellitus with proliferative diabetic retinopathy with macular edema, left eye: Secondary | ICD-10-CM

## 2023-06-17 DIAGNOSIS — Z7984 Long term (current) use of oral hypoglycemic drugs: Secondary | ICD-10-CM | POA: Diagnosis not present

## 2023-06-17 DIAGNOSIS — H43813 Vitreous degeneration, bilateral: Secondary | ICD-10-CM

## 2023-07-15 ENCOUNTER — Encounter (INDEPENDENT_AMBULATORY_CARE_PROVIDER_SITE_OTHER): Payer: BC Managed Care – PPO | Admitting: Ophthalmology

## 2023-07-15 DIAGNOSIS — Z7984 Long term (current) use of oral hypoglycemic drugs: Secondary | ICD-10-CM

## 2023-07-15 DIAGNOSIS — H43813 Vitreous degeneration, bilateral: Secondary | ICD-10-CM

## 2023-07-15 DIAGNOSIS — E113512 Type 2 diabetes mellitus with proliferative diabetic retinopathy with macular edema, left eye: Secondary | ICD-10-CM | POA: Diagnosis not present

## 2023-07-15 DIAGNOSIS — E113311 Type 2 diabetes mellitus with moderate nonproliferative diabetic retinopathy with macular edema, right eye: Secondary | ICD-10-CM

## 2023-08-19 ENCOUNTER — Encounter (INDEPENDENT_AMBULATORY_CARE_PROVIDER_SITE_OTHER): Payer: BC Managed Care – PPO | Admitting: Ophthalmology

## 2023-08-19 DIAGNOSIS — E113311 Type 2 diabetes mellitus with moderate nonproliferative diabetic retinopathy with macular edema, right eye: Secondary | ICD-10-CM | POA: Diagnosis not present

## 2023-08-19 DIAGNOSIS — Z7984 Long term (current) use of oral hypoglycemic drugs: Secondary | ICD-10-CM | POA: Diagnosis not present

## 2023-08-19 DIAGNOSIS — E113512 Type 2 diabetes mellitus with proliferative diabetic retinopathy with macular edema, left eye: Secondary | ICD-10-CM

## 2023-08-19 DIAGNOSIS — H43813 Vitreous degeneration, bilateral: Secondary | ICD-10-CM

## 2023-09-17 ENCOUNTER — Encounter (INDEPENDENT_AMBULATORY_CARE_PROVIDER_SITE_OTHER): Payer: BC Managed Care – PPO | Admitting: Ophthalmology

## 2023-09-17 DIAGNOSIS — E113512 Type 2 diabetes mellitus with proliferative diabetic retinopathy with macular edema, left eye: Secondary | ICD-10-CM

## 2023-09-17 DIAGNOSIS — Z7984 Long term (current) use of oral hypoglycemic drugs: Secondary | ICD-10-CM

## 2023-09-17 DIAGNOSIS — H43813 Vitreous degeneration, bilateral: Secondary | ICD-10-CM | POA: Diagnosis not present

## 2023-09-17 DIAGNOSIS — E113311 Type 2 diabetes mellitus with moderate nonproliferative diabetic retinopathy with macular edema, right eye: Secondary | ICD-10-CM | POA: Diagnosis not present

## 2023-10-15 ENCOUNTER — Encounter (INDEPENDENT_AMBULATORY_CARE_PROVIDER_SITE_OTHER): Payer: BC Managed Care – PPO | Admitting: Ophthalmology

## 2023-10-15 DIAGNOSIS — H43813 Vitreous degeneration, bilateral: Secondary | ICD-10-CM

## 2023-10-15 DIAGNOSIS — E113512 Type 2 diabetes mellitus with proliferative diabetic retinopathy with macular edema, left eye: Secondary | ICD-10-CM | POA: Diagnosis not present

## 2023-10-15 DIAGNOSIS — Z7985 Long-term (current) use of injectable non-insulin antidiabetic drugs: Secondary | ICD-10-CM | POA: Diagnosis not present

## 2023-10-15 DIAGNOSIS — Z7984 Long term (current) use of oral hypoglycemic drugs: Secondary | ICD-10-CM | POA: Diagnosis not present

## 2023-10-15 DIAGNOSIS — E113391 Type 2 diabetes mellitus with moderate nonproliferative diabetic retinopathy without macular edema, right eye: Secondary | ICD-10-CM | POA: Diagnosis not present

## 2023-11-12 ENCOUNTER — Encounter (INDEPENDENT_AMBULATORY_CARE_PROVIDER_SITE_OTHER): Payer: BC Managed Care – PPO | Admitting: Ophthalmology

## 2023-11-19 ENCOUNTER — Encounter (INDEPENDENT_AMBULATORY_CARE_PROVIDER_SITE_OTHER): Payer: BC Managed Care – PPO | Admitting: Ophthalmology

## 2023-11-19 DIAGNOSIS — Z7984 Long term (current) use of oral hypoglycemic drugs: Secondary | ICD-10-CM

## 2023-11-19 DIAGNOSIS — E113311 Type 2 diabetes mellitus with moderate nonproliferative diabetic retinopathy with macular edema, right eye: Secondary | ICD-10-CM | POA: Diagnosis not present

## 2023-11-19 DIAGNOSIS — Z7985 Long-term (current) use of injectable non-insulin antidiabetic drugs: Secondary | ICD-10-CM

## 2023-11-19 DIAGNOSIS — H43813 Vitreous degeneration, bilateral: Secondary | ICD-10-CM

## 2023-11-19 DIAGNOSIS — E113512 Type 2 diabetes mellitus with proliferative diabetic retinopathy with macular edema, left eye: Secondary | ICD-10-CM | POA: Diagnosis not present

## 2023-11-19 DIAGNOSIS — H2513 Age-related nuclear cataract, bilateral: Secondary | ICD-10-CM

## 2023-12-17 ENCOUNTER — Encounter (INDEPENDENT_AMBULATORY_CARE_PROVIDER_SITE_OTHER): Payer: BC Managed Care – PPO | Admitting: Ophthalmology

## 2023-12-17 DIAGNOSIS — H43813 Vitreous degeneration, bilateral: Secondary | ICD-10-CM | POA: Diagnosis not present

## 2023-12-17 DIAGNOSIS — E113313 Type 2 diabetes mellitus with moderate nonproliferative diabetic retinopathy with macular edema, bilateral: Secondary | ICD-10-CM | POA: Diagnosis not present

## 2023-12-17 DIAGNOSIS — Z7984 Long term (current) use of oral hypoglycemic drugs: Secondary | ICD-10-CM

## 2023-12-17 DIAGNOSIS — Z7985 Long-term (current) use of injectable non-insulin antidiabetic drugs: Secondary | ICD-10-CM | POA: Diagnosis not present

## 2024-01-14 ENCOUNTER — Encounter (INDEPENDENT_AMBULATORY_CARE_PROVIDER_SITE_OTHER): Payer: BC Managed Care – PPO | Admitting: Ophthalmology

## 2024-01-14 DIAGNOSIS — E113311 Type 2 diabetes mellitus with moderate nonproliferative diabetic retinopathy with macular edema, right eye: Secondary | ICD-10-CM

## 2024-01-14 DIAGNOSIS — Z7985 Long-term (current) use of injectable non-insulin antidiabetic drugs: Secondary | ICD-10-CM | POA: Diagnosis not present

## 2024-01-14 DIAGNOSIS — H2513 Age-related nuclear cataract, bilateral: Secondary | ICD-10-CM

## 2024-01-14 DIAGNOSIS — H43813 Vitreous degeneration, bilateral: Secondary | ICD-10-CM

## 2024-01-14 DIAGNOSIS — E113512 Type 2 diabetes mellitus with proliferative diabetic retinopathy with macular edema, left eye: Secondary | ICD-10-CM | POA: Diagnosis not present

## 2024-01-14 DIAGNOSIS — Z7984 Long term (current) use of oral hypoglycemic drugs: Secondary | ICD-10-CM

## 2024-02-11 ENCOUNTER — Encounter (INDEPENDENT_AMBULATORY_CARE_PROVIDER_SITE_OTHER): Payer: BC Managed Care – PPO | Admitting: Ophthalmology

## 2024-02-11 DIAGNOSIS — E113311 Type 2 diabetes mellitus with moderate nonproliferative diabetic retinopathy with macular edema, right eye: Secondary | ICD-10-CM | POA: Diagnosis not present

## 2024-02-11 DIAGNOSIS — H43813 Vitreous degeneration, bilateral: Secondary | ICD-10-CM

## 2024-02-11 DIAGNOSIS — E113512 Type 2 diabetes mellitus with proliferative diabetic retinopathy with macular edema, left eye: Secondary | ICD-10-CM | POA: Diagnosis not present

## 2024-02-11 DIAGNOSIS — Z7985 Long-term (current) use of injectable non-insulin antidiabetic drugs: Secondary | ICD-10-CM | POA: Diagnosis not present

## 2024-02-11 DIAGNOSIS — Z794 Long term (current) use of insulin: Secondary | ICD-10-CM | POA: Diagnosis not present

## 2024-03-10 ENCOUNTER — Encounter (INDEPENDENT_AMBULATORY_CARE_PROVIDER_SITE_OTHER): Admitting: Ophthalmology

## 2024-03-10 DIAGNOSIS — E113512 Type 2 diabetes mellitus with proliferative diabetic retinopathy with macular edema, left eye: Secondary | ICD-10-CM | POA: Diagnosis not present

## 2024-03-10 DIAGNOSIS — Z7984 Long term (current) use of oral hypoglycemic drugs: Secondary | ICD-10-CM

## 2024-03-10 DIAGNOSIS — Z7985 Long-term (current) use of injectable non-insulin antidiabetic drugs: Secondary | ICD-10-CM

## 2024-03-10 DIAGNOSIS — H43813 Vitreous degeneration, bilateral: Secondary | ICD-10-CM

## 2024-03-10 DIAGNOSIS — E113311 Type 2 diabetes mellitus with moderate nonproliferative diabetic retinopathy with macular edema, right eye: Secondary | ICD-10-CM | POA: Diagnosis not present

## 2024-04-07 ENCOUNTER — Encounter (INDEPENDENT_AMBULATORY_CARE_PROVIDER_SITE_OTHER): Admitting: Ophthalmology

## 2024-04-07 DIAGNOSIS — E113512 Type 2 diabetes mellitus with proliferative diabetic retinopathy with macular edema, left eye: Secondary | ICD-10-CM | POA: Diagnosis not present

## 2024-04-07 DIAGNOSIS — E113311 Type 2 diabetes mellitus with moderate nonproliferative diabetic retinopathy with macular edema, right eye: Secondary | ICD-10-CM

## 2024-04-07 DIAGNOSIS — Z7985 Long-term (current) use of injectable non-insulin antidiabetic drugs: Secondary | ICD-10-CM

## 2024-04-07 DIAGNOSIS — Z7984 Long term (current) use of oral hypoglycemic drugs: Secondary | ICD-10-CM | POA: Diagnosis not present

## 2024-04-07 DIAGNOSIS — H43813 Vitreous degeneration, bilateral: Secondary | ICD-10-CM

## 2024-05-05 ENCOUNTER — Encounter (INDEPENDENT_AMBULATORY_CARE_PROVIDER_SITE_OTHER): Admitting: Ophthalmology

## 2024-05-05 DIAGNOSIS — H43813 Vitreous degeneration, bilateral: Secondary | ICD-10-CM

## 2024-05-05 DIAGNOSIS — Z7984 Long term (current) use of oral hypoglycemic drugs: Secondary | ICD-10-CM

## 2024-05-05 DIAGNOSIS — H35033 Hypertensive retinopathy, bilateral: Secondary | ICD-10-CM

## 2024-05-05 DIAGNOSIS — Z7985 Long-term (current) use of injectable non-insulin antidiabetic drugs: Secondary | ICD-10-CM | POA: Diagnosis not present

## 2024-05-05 DIAGNOSIS — E113311 Type 2 diabetes mellitus with moderate nonproliferative diabetic retinopathy with macular edema, right eye: Secondary | ICD-10-CM

## 2024-05-05 DIAGNOSIS — E113512 Type 2 diabetes mellitus with proliferative diabetic retinopathy with macular edema, left eye: Secondary | ICD-10-CM | POA: Diagnosis not present

## 2024-05-05 DIAGNOSIS — I10 Essential (primary) hypertension: Secondary | ICD-10-CM

## 2024-05-05 DIAGNOSIS — H2513 Age-related nuclear cataract, bilateral: Secondary | ICD-10-CM

## 2024-06-02 ENCOUNTER — Encounter (INDEPENDENT_AMBULATORY_CARE_PROVIDER_SITE_OTHER): Admitting: Ophthalmology

## 2024-06-02 DIAGNOSIS — H43813 Vitreous degeneration, bilateral: Secondary | ICD-10-CM

## 2024-06-02 DIAGNOSIS — E113512 Type 2 diabetes mellitus with proliferative diabetic retinopathy with macular edema, left eye: Secondary | ICD-10-CM

## 2024-06-02 DIAGNOSIS — Z7984 Long term (current) use of oral hypoglycemic drugs: Secondary | ICD-10-CM | POA: Diagnosis not present

## 2024-06-02 DIAGNOSIS — Z7985 Long-term (current) use of injectable non-insulin antidiabetic drugs: Secondary | ICD-10-CM | POA: Diagnosis not present

## 2024-06-02 DIAGNOSIS — E113311 Type 2 diabetes mellitus with moderate nonproliferative diabetic retinopathy with macular edema, right eye: Secondary | ICD-10-CM

## 2024-06-30 ENCOUNTER — Encounter (INDEPENDENT_AMBULATORY_CARE_PROVIDER_SITE_OTHER): Admitting: Ophthalmology

## 2024-06-30 DIAGNOSIS — Z7984 Long term (current) use of oral hypoglycemic drugs: Secondary | ICD-10-CM

## 2024-06-30 DIAGNOSIS — Z7985 Long-term (current) use of injectable non-insulin antidiabetic drugs: Secondary | ICD-10-CM | POA: Diagnosis not present

## 2024-06-30 DIAGNOSIS — H43813 Vitreous degeneration, bilateral: Secondary | ICD-10-CM

## 2024-06-30 DIAGNOSIS — E113592 Type 2 diabetes mellitus with proliferative diabetic retinopathy without macular edema, left eye: Secondary | ICD-10-CM

## 2024-06-30 DIAGNOSIS — E113311 Type 2 diabetes mellitus with moderate nonproliferative diabetic retinopathy with macular edema, right eye: Secondary | ICD-10-CM | POA: Diagnosis not present

## 2024-07-28 ENCOUNTER — Encounter (INDEPENDENT_AMBULATORY_CARE_PROVIDER_SITE_OTHER): Admitting: Ophthalmology

## 2024-07-28 DIAGNOSIS — Z7985 Long-term (current) use of injectable non-insulin antidiabetic drugs: Secondary | ICD-10-CM

## 2024-07-28 DIAGNOSIS — E113311 Type 2 diabetes mellitus with moderate nonproliferative diabetic retinopathy with macular edema, right eye: Secondary | ICD-10-CM

## 2024-07-28 DIAGNOSIS — H43813 Vitreous degeneration, bilateral: Secondary | ICD-10-CM

## 2024-07-28 DIAGNOSIS — Z7984 Long term (current) use of oral hypoglycemic drugs: Secondary | ICD-10-CM | POA: Diagnosis not present

## 2024-07-28 DIAGNOSIS — E113592 Type 2 diabetes mellitus with proliferative diabetic retinopathy without macular edema, left eye: Secondary | ICD-10-CM | POA: Diagnosis not present

## 2024-08-25 ENCOUNTER — Encounter (INDEPENDENT_AMBULATORY_CARE_PROVIDER_SITE_OTHER): Admitting: Ophthalmology

## 2024-08-25 DIAGNOSIS — E113311 Type 2 diabetes mellitus with moderate nonproliferative diabetic retinopathy with macular edema, right eye: Secondary | ICD-10-CM

## 2024-08-25 DIAGNOSIS — E113512 Type 2 diabetes mellitus with proliferative diabetic retinopathy with macular edema, left eye: Secondary | ICD-10-CM

## 2024-08-25 DIAGNOSIS — Z794 Long term (current) use of insulin: Secondary | ICD-10-CM

## 2024-08-25 DIAGNOSIS — H43813 Vitreous degeneration, bilateral: Secondary | ICD-10-CM

## 2024-08-25 DIAGNOSIS — H2513 Age-related nuclear cataract, bilateral: Secondary | ICD-10-CM

## 2024-08-25 DIAGNOSIS — Z7985 Long-term (current) use of injectable non-insulin antidiabetic drugs: Secondary | ICD-10-CM

## 2024-09-29 ENCOUNTER — Encounter (INDEPENDENT_AMBULATORY_CARE_PROVIDER_SITE_OTHER): Admitting: Ophthalmology

## 2024-09-29 DIAGNOSIS — H2513 Age-related nuclear cataract, bilateral: Secondary | ICD-10-CM

## 2024-09-29 DIAGNOSIS — E113592 Type 2 diabetes mellitus with proliferative diabetic retinopathy without macular edema, left eye: Secondary | ICD-10-CM

## 2024-09-29 DIAGNOSIS — Z7984 Long term (current) use of oral hypoglycemic drugs: Secondary | ICD-10-CM | POA: Diagnosis not present

## 2024-09-29 DIAGNOSIS — Z7985 Long-term (current) use of injectable non-insulin antidiabetic drugs: Secondary | ICD-10-CM | POA: Diagnosis not present

## 2024-09-29 DIAGNOSIS — E113311 Type 2 diabetes mellitus with moderate nonproliferative diabetic retinopathy with macular edema, right eye: Secondary | ICD-10-CM | POA: Diagnosis not present

## 2024-09-29 DIAGNOSIS — H43813 Vitreous degeneration, bilateral: Secondary | ICD-10-CM

## 2024-10-27 ENCOUNTER — Encounter (INDEPENDENT_AMBULATORY_CARE_PROVIDER_SITE_OTHER): Admitting: Ophthalmology

## 2024-10-27 DIAGNOSIS — E113311 Type 2 diabetes mellitus with moderate nonproliferative diabetic retinopathy with macular edema, right eye: Secondary | ICD-10-CM

## 2024-10-27 DIAGNOSIS — Z7984 Long term (current) use of oral hypoglycemic drugs: Secondary | ICD-10-CM

## 2024-10-27 DIAGNOSIS — Z7985 Long-term (current) use of injectable non-insulin antidiabetic drugs: Secondary | ICD-10-CM | POA: Diagnosis not present

## 2024-10-27 DIAGNOSIS — H2513 Age-related nuclear cataract, bilateral: Secondary | ICD-10-CM

## 2024-10-27 DIAGNOSIS — H43813 Vitreous degeneration, bilateral: Secondary | ICD-10-CM

## 2024-10-27 DIAGNOSIS — E113512 Type 2 diabetes mellitus with proliferative diabetic retinopathy with macular edema, left eye: Secondary | ICD-10-CM

## 2024-11-24 ENCOUNTER — Encounter (INDEPENDENT_AMBULATORY_CARE_PROVIDER_SITE_OTHER): Admitting: Ophthalmology

## 2024-11-24 DIAGNOSIS — Z7985 Long-term (current) use of injectable non-insulin antidiabetic drugs: Secondary | ICD-10-CM

## 2024-11-24 DIAGNOSIS — H2513 Age-related nuclear cataract, bilateral: Secondary | ICD-10-CM | POA: Diagnosis not present

## 2024-11-24 DIAGNOSIS — Z7984 Long term (current) use of oral hypoglycemic drugs: Secondary | ICD-10-CM | POA: Diagnosis not present

## 2024-11-24 DIAGNOSIS — E113311 Type 2 diabetes mellitus with moderate nonproliferative diabetic retinopathy with macular edema, right eye: Secondary | ICD-10-CM | POA: Diagnosis not present

## 2024-11-24 DIAGNOSIS — H43813 Vitreous degeneration, bilateral: Secondary | ICD-10-CM | POA: Diagnosis not present

## 2024-11-24 DIAGNOSIS — E113512 Type 2 diabetes mellitus with proliferative diabetic retinopathy with macular edema, left eye: Secondary | ICD-10-CM | POA: Diagnosis not present

## 2024-12-22 ENCOUNTER — Encounter (INDEPENDENT_AMBULATORY_CARE_PROVIDER_SITE_OTHER): Admitting: Ophthalmology

## 2024-12-25 ENCOUNTER — Encounter (INDEPENDENT_AMBULATORY_CARE_PROVIDER_SITE_OTHER): Admitting: Ophthalmology

## 2024-12-25 DIAGNOSIS — H2513 Age-related nuclear cataract, bilateral: Secondary | ICD-10-CM

## 2024-12-25 DIAGNOSIS — E113592 Type 2 diabetes mellitus with proliferative diabetic retinopathy without macular edema, left eye: Secondary | ICD-10-CM | POA: Diagnosis not present

## 2024-12-25 DIAGNOSIS — Z7984 Long term (current) use of oral hypoglycemic drugs: Secondary | ICD-10-CM

## 2024-12-25 DIAGNOSIS — E113311 Type 2 diabetes mellitus with moderate nonproliferative diabetic retinopathy with macular edema, right eye: Secondary | ICD-10-CM

## 2024-12-25 DIAGNOSIS — Z7985 Long-term (current) use of injectable non-insulin antidiabetic drugs: Secondary | ICD-10-CM | POA: Diagnosis not present

## 2024-12-25 DIAGNOSIS — H43813 Vitreous degeneration, bilateral: Secondary | ICD-10-CM | POA: Diagnosis not present

## 2025-01-23 ENCOUNTER — Encounter (INDEPENDENT_AMBULATORY_CARE_PROVIDER_SITE_OTHER): Admitting: Ophthalmology
